# Patient Record
Sex: Male | Born: 1965
Health system: Southern US, Community
[De-identification: ages and names within clinical notes are randomized; demographics above are authoritative.]

## PROBLEM LIST (undated history)

## (undated) DIAGNOSIS — F419 Anxiety disorder, unspecified: Secondary | ICD-10-CM

## (undated) DIAGNOSIS — R399 Unspecified symptoms and signs involving the genitourinary system: Secondary | ICD-10-CM

## (undated) DIAGNOSIS — B019 Varicella without complication: Secondary | ICD-10-CM

## (undated) DIAGNOSIS — I2699 Other pulmonary embolism without acute cor pulmonale: Secondary | ICD-10-CM

## (undated) DIAGNOSIS — D682 Hereditary deficiency of other clotting factors: Secondary | ICD-10-CM

## (undated) HISTORY — DX: Anxiety disorder, unspecified: F41.9

## (undated) HISTORY — DX: Other pulmonary embolism without acute cor pulmonale: I26.99

## (undated) HISTORY — DX: Unspecified symptoms and signs involving the genitourinary system: R39.9

## (undated) HISTORY — DX: Hereditary deficiency of other clotting factors: D68.2

## (undated) HISTORY — PX: EYE SURGERY: SHX253

## (undated) HISTORY — DX: Varicella without complication: B01.9

---

## 2000-10-24 HISTORY — PX: OTHER SURGICAL HISTORY: SHX169

## 2016-10-24 HISTORY — PX: COLON SURGERY: SHX602

## 2018-05-02 ENCOUNTER — Telehealth: Payer: Self-pay

## 2018-05-02 NOTE — Telephone Encounter (Signed)
Please advise 

## 2018-05-02 NOTE — Telephone Encounter (Signed)
Copied from CRM [id number]. Topic: Appointment Scheduling - New Patient >> May 02, 2018 10:42 AM Windy KalataMichael, Taylor L, NT wrote: Patient is calling and is new to the area and was referred to Dr. Drue NovelPaz by patient Dario AveMark Joel. He would like to know if Dr. Drue NovelPaz will accept him as a new patient.   Route to department's PEC pool.

## 2018-05-02 NOTE — Telephone Encounter (Signed)
Please schedule a new patient visit at the patient's convenience

## 2018-05-02 NOTE — Telephone Encounter (Signed)
Called pt and scheduled a NP appt for 06/12/18

## 2018-05-02 NOTE — Telephone Encounter (Signed)
Okay to schedule NP appt at his convenience. Thank you.  

## 2018-06-12 ENCOUNTER — Ambulatory Visit: Payer: Self-pay | Admitting: Internal Medicine

## 2018-06-18 DIAGNOSIS — R3911 Hesitancy of micturition: Secondary | ICD-10-CM

## 2018-06-18 DIAGNOSIS — K5790 Diverticulosis of intestine, part unspecified, without perforation or abscess without bleeding: Secondary | ICD-10-CM | POA: Insufficient documentation

## 2018-06-18 DIAGNOSIS — N401 Enlarged prostate with lower urinary tract symptoms: Secondary | ICD-10-CM | POA: Insufficient documentation

## 2018-06-18 DIAGNOSIS — R590 Localized enlarged lymph nodes: Secondary | ICD-10-CM | POA: Insufficient documentation

## 2018-06-21 ENCOUNTER — Encounter: Payer: Self-pay | Admitting: Internal Medicine

## 2018-06-21 ENCOUNTER — Ambulatory Visit: Payer: PRIVATE HEALTH INSURANCE | Admitting: Internal Medicine

## 2018-06-21 VITALS — BP 115/69 | HR 91 | Temp 99.0°F | Resp 16 | Ht 70.5 in | Wt 276.0 lb

## 2018-06-21 DIAGNOSIS — I2699 Other pulmonary embolism without acute cor pulmonale: Secondary | ICD-10-CM | POA: Insufficient documentation

## 2018-06-21 DIAGNOSIS — K5792 Diverticulitis of intestine, part unspecified, without perforation or abscess without bleeding: Secondary | ICD-10-CM

## 2018-06-21 DIAGNOSIS — F419 Anxiety disorder, unspecified: Secondary | ICD-10-CM | POA: Diagnosis not present

## 2018-06-21 DIAGNOSIS — Z09 Encounter for follow-up examination after completed treatment for conditions other than malignant neoplasm: Secondary | ICD-10-CM | POA: Insufficient documentation

## 2018-06-21 DIAGNOSIS — D682 Hereditary deficiency of other clotting factors: Secondary | ICD-10-CM | POA: Insufficient documentation

## 2018-06-21 DIAGNOSIS — R399 Unspecified symptoms and signs involving the genitourinary system: Secondary | ICD-10-CM | POA: Insufficient documentation

## 2018-06-21 LAB — CBC WITH DIFFERENTIAL/PLATELET
Basophils Absolute: 0.1 10*3/uL (ref 0.0–0.1)
Basophils Relative: 0.8 % (ref 0.0–3.0)
Eosinophils Absolute: 0.3 10*3/uL (ref 0.0–0.7)
Eosinophils Relative: 3.3 % (ref 0.0–5.0)
HEMATOCRIT: 44 % (ref 39.0–52.0)
Hemoglobin: 15.4 g/dL (ref 13.0–17.0)
LYMPHS ABS: 0.7 10*3/uL (ref 0.7–4.0)
Lymphocytes Relative: 9.2 % — ABNORMAL LOW (ref 12.0–46.0)
MCHC: 34.9 g/dL (ref 30.0–36.0)
MCV: 85.2 fl (ref 78.0–100.0)
MONOS PCT: 7.4 % (ref 3.0–12.0)
Monocytes Absolute: 0.6 10*3/uL (ref 0.1–1.0)
NEUTROS PCT: 79.3 % — AB (ref 43.0–77.0)
Neutro Abs: 6.1 10*3/uL (ref 1.4–7.7)
Platelets: 220 10*3/uL (ref 150.0–400.0)
RBC: 5.17 Mil/uL (ref 4.22–5.81)
RDW: 14.3 % (ref 11.5–15.5)
WBC: 7.7 10*3/uL (ref 4.0–10.5)

## 2018-06-21 LAB — COMPREHENSIVE METABOLIC PANEL
ALBUMIN: 4 g/dL (ref 3.5–5.2)
ALK PHOS: 60 U/L (ref 39–117)
ALT: 16 U/L (ref 0–53)
AST: 13 U/L (ref 0–37)
BILIRUBIN TOTAL: 0.9 mg/dL (ref 0.2–1.2)
BUN: 16 mg/dL (ref 6–23)
CALCIUM: 9 mg/dL (ref 8.4–10.5)
CO2: 27 mEq/L (ref 19–32)
Chloride: 106 mEq/L (ref 96–112)
Creatinine, Ser: 0.92 mg/dL (ref 0.40–1.50)
GFR: 91.93 mL/min (ref >60.00)
Glucose, Bld: 108 mg/dL — ABNORMAL HIGH (ref 70–99)
Potassium: 4 mEq/L (ref 3.5–5.1)
Sodium: 140 mEq/L (ref 135–145)
Total Protein: 6.5 g/dL (ref 6.0–8.3)

## 2018-06-21 LAB — POCT INR: INR: 1.9 — AB (ref 2.0–3.0)

## 2018-06-21 NOTE — Assessment & Plan Note (Signed)
Diverticulitis, right-sided: Dx 3 days ago via CAT scan, on Augmentin, clinically much improved.  Recommend to complete 10-day treatment. Abnormal CT: CT done for diverticulitis also showed a subcarinal and hilar lymphadenopathy, they recommended a nonemergent chest CT.  Will order a CT when he comes back in 2 months. 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  10 mg daily is actually his typical dose. INR today 1.9. Plan: Coumadin 10 mg daily except Monday and Thursday 15 mg. INR in 2 weeks. Consider hematology referral. Anxiety: Under excellent control since he started SSRIs approximately 2009 LUTS: At some point had symptoms, PCP prescribed Flomax approximately 4 years ago, it has made a big difference.  Refill as needed ROI from  PCP-GI- hematology Social: frequent flyer for work Follow-up: 2 weeks INR, 2 months office visit

## 2018-06-21 NOTE — Patient Instructions (Addendum)
GO TO THE LAB : Get the blood work     GO TO THE FRONT DESK Schedule your next appointment for a Coumadin check in 2 weeks from today  Schedule a office visit with me in 2 months  Coumadin: INR today is 1.9. Take 10 mg daily except on Thursdays and Mondays: 15 mg

## 2018-06-21 NOTE — Progress Notes (Signed)
Subjective:    Patient ID: Joshua Blair, male    DOB: 05/01/1966, 52 y.o.   MRN: [id number]  DOS:  06/21/2018 Type of visit - description : New patient, here with his wife Interval history:  Patient moved to this area recently.  Was doing well until 5 days ago when he started to develop lower abdominal pain. Went to urgent care 06/18/2018, he was found indeed to be tender in the lower abdomen, had a low-grade fever.  CT was done and showed diverticulitis. No blood work. Prescribed Augmentin and he is feeling much improved.  Coumadin therapy: Most recent INR was 3.5 few days ago, at the time he was taking 10 mg daily except Wednesday and Saturday: 20 mg. He was recommended to take 10 mg daily  Anxiety: On citalopram, well controlled  Review of Systems Currently with no fever chills No nausea, vomiting, diarrhea. No constipation. Denies any blood in the stools or blood in the urine.   Past Medical History:  Diagnosis Date  . Chicken pox   . Pulmonary emboli [facility])     Past Surgical History:  Procedure Laterality Date  . cholecystectomy  2002  . COLON SURGERY  2018   to remove benign polyps  . EYE SURGERY Bilateral 2015 rt/ 2017 lt   for detached retina    Social History   Socioeconomic History  . Marital status: Married    Spouse name: Maralyn Sago  . Number of children: 3  . Years of education: Not on file  . Highest education level: Not on file  Occupational History  . Occupation: Product manager  Social Needs  . Financial resource strain: Not hard at all  . Food insecurity:    Worry: Never true    Inability: Never true  . Transportation needs:    Medical: No    Non-medical: No  Tobacco Use  . Smoking status: Former Smoker    Last attempt to quit: 06/21/2006    Years since quitting: 12.0  . Smokeless tobacco: Never Used  Substance and Sexual Activity  . Alcohol use: Yes    Comment: rarely  . Drug use: Never  . Sexual activity: Yes    Partners: Female   Lifestyle  . Physical activity:    Days per week: 0 days    Minutes per session: Not on file  . Stress: Not on file  Relationships  . Social connections:    Talks on phone: Not on file    Gets together: Never    Attends religious service: 1 to 4 times per year    Active member of club or organization: No    Attends meetings of clubs or organizations: Never    Relationship status: Married  . Intimate partner violence:    Fear of current or ex partner: No    Emotionally abused: No    Physically abused: No    Forced sexual activity: No  Other Topics Concern  . Not on file  Social History Narrative   Moved to [place] 2019 from [place]      Family History  Problem Relation Age of Onset  . Hypertension Mother   . Diabetes Mother   . Diabetes Brother   . Diabetes Brother   . Diabetes Brother   . Diabetes Brother   . Diabetes Brother   . Diabetes Brother   . HIV Brother   . Colon cancer Neg Hx   . Prostate cancer Neg Hx      Allergies as  of 06/21/2018   Not on File     Medication List        Accurate as of 06/21/18  5:45 PM. Always use your most recent med list.          amoxicillin-clavulanate 875-125 MG tablet Commonly known as:  AUGMENTIN Take by mouth.   citalopram 20 MG tablet Commonly known as:  CELEXA Take by mouth.   tamsulosin 0.4 MG Caps capsule Commonly known as:  FLOMAX Take by mouth.   warfarin 10 MG tablet Commonly known as:  COUMADIN TAKE 1 TABLET BY MOUTH DAILY FOR 6 DAYS A WEEK THEN ONE AND ONE HALF TABLET ONCE A WEEK OR AS DIRECTED          Objective:   Physical Exam BP 115/69 (BP Location: Right Arm, Patient Position: Sitting, Cuff Size: Large)   Pulse 91   Temp 99 F (37.2 C) (Oral)   Resp 16   Ht 5' 10.5" (1.791 m)   Wt 276 lb (125.2 kg)   SpO2 98%   BMI 39.04 kg/m  General:   Well developed, NAD, see BMI.  HEENT:  Normocephalic . Face symmetric, atraumatic Lungs:  CTA B Normal respiratory effort, no intercostal  retractions, no accessory muscle use. Heart: RRR,  no murmur.  no pretibial edema bilaterally  Abdomen:  Not distended, soft, non-tender. No rebound or rigidity.   Skin: Not pale. Not jaundice Neurologic:  alert & oriented X3.  Speech normal, gait appropriate for age and unassisted Psych--  Cognition and judgment appear intact.  Cooperative with normal attention span and concentration.  Behavior appropriate. No anxious or depressed appearing.     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  Recommend to complete 10-day treatment. Abnormal CT: CT done for diverticulitis also showed a subcarinal and hilar lymphadenopathy, they recommended a nonemergent chest CT.  Will order a CT when he comes back in 2 months. 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  10 mg daily is actually his typical dose. INR today 1.9. Plan: Coumadin 10 mg daily except Monday and Thursday 15 mg. INR in 2 weeks. Consider hematology referral. Anxiety: Under excellent control since he started SSRIs approximately 2009 LUTS: At some point had symptoms, PCP prescribed Flomax approximately 4 years ago, it has made a big difference.  Refill as needed ROI from  PCP-GI- hematology Social: frequent flyer for work Follow-up: 2 weeks INR, 2 months office visit  Today, I spent more than   \30 min with the patient: >50% of the time counseling regards coumadin mngmt, abnormal CT report, multiple questions answer

## 2018-07-05 ENCOUNTER — Ambulatory Visit: Payer: PRIVATE HEALTH INSURANCE

## 2018-07-08 ENCOUNTER — Telehealth: Payer: Self-pay | Admitting: Internal Medicine

## 2018-07-08 NOTE — Telephone Encounter (Signed)
Due for an INR, please arrange 

## 2018-07-09 NOTE — Telephone Encounter (Addendum)
Called patient to schedule INR check. States he is out of town in [place] working and won't be back for a month. Scheduled INR check for  10/15/119. Patietn states he has had not complications.

## 2018-07-09 NOTE — Telephone Encounter (Signed)
Called patient who states he can go to [facility] to get [facility]R checked, reminded patient to call office with results and keep appointment he has scheduled with this office.. Patient agreed.

## 2018-07-09 NOTE — Telephone Encounter (Signed)
At the last visit, his Coumadin dose was adjusted. I recommend the patient go to urgent care in [place] and get his INR checked this week. Also keep the appointment as a schedule.

## 2018-08-07 ENCOUNTER — Ambulatory Visit: Payer: PRIVATE HEALTH INSURANCE

## 2018-08-10 ENCOUNTER — Ambulatory Visit (INDEPENDENT_AMBULATORY_CARE_PROVIDER_SITE_OTHER): Payer: PRIVATE HEALTH INSURANCE | Admitting: Internal Medicine

## 2018-08-10 ENCOUNTER — Encounter: Payer: Self-pay | Admitting: Internal Medicine

## 2018-08-10 ENCOUNTER — Ambulatory Visit: Payer: PRIVATE HEALTH INSURANCE | Admitting: Internal Medicine

## 2018-08-10 VITALS — BP 128/84 | HR 85 | Temp 98.5°F | Resp 16 | Ht 70.5 in | Wt 282.5 lb

## 2018-08-10 DIAGNOSIS — H9209 Otalgia, unspecified ear: Secondary | ICD-10-CM

## 2018-08-10 DIAGNOSIS — Z7901 Long term (current) use of anticoagulants: Secondary | ICD-10-CM

## 2018-08-10 DIAGNOSIS — Z23 Encounter for immunization: Secondary | ICD-10-CM

## 2018-08-10 LAB — POCT INR: INR: 3.6 — AB (ref 2.0–3.0)

## 2018-08-10 NOTE — Progress Notes (Signed)
Subjective:    Patient ID: Joshua Blair, male    DOB: Nov 21, 1965, 52 y.o.   MRN: [id number]  DOS:  08/10/2018 Type of visit - description : acute Interval history: He was here for a Coumadin check but also complaining of left ear pain. Symptoms of started this morning, he feels like a stinging at the outside of the ear canal.  Denies any injury with a Q-tip  Review of Systems Denies any purulent or bloody discharge from the ear. Hearing is normal No recent URI  Past Medical History:  Diagnosis Date  . Anxiety   . Chicken pox   . Factor V deficiency (HCC)   . Lower urinary tract symptoms (LUTS)   . Pulmonary emboli [facility])     Past Surgical History:  Procedure Laterality Date  . cholecystectomy  2002  . COLON SURGERY  2018   to remove benign polyps  . EYE SURGERY Bilateral 2015 rt/ 2017 lt   for detached retina    Social History   Socioeconomic History  . Marital status: Married    Spouse name: Maralyn Sago  . Number of children: 3  . Years of education: Not on file  . Highest education level: Not on file  Occupational History  . Occupation: Product manager  Social Needs  . Financial resource strain: Not hard at all  . Food insecurity:    Worry: Never true    Inability: Never true  . Transportation needs:    Medical: No    Non-medical: No  Tobacco Use  . Smoking status: Former Smoker    Last attempt to quit: 06/21/2006    Years since quitting: 12.1  . Smokeless tobacco: Never Used  Substance and Sexual Activity  . Alcohol use: Yes    Comment: rarely  . Drug use: Never  . Sexual activity: Yes    Partners: Female  Lifestyle  . Physical activity:    Days per week: 0 days    Minutes per session: Not on file  . Stress: Not on file  Relationships  . Social connections:    Talks on phone: Not on file    Gets together: Never    Attends religious service: 1 to 4 times per year    Active member of club or organization: No    Attends meetings of clubs or  organizations: Never    Relationship status: Married  . Intimate partner violence:    Fear of current or ex partner: No    Emotionally abused: No    Physically abused: No    Forced sexual activity: No  Other Topics Concern  . Not on file  Social History Narrative   Moved to [place] 2019 from [place]       Allergies as of 08/10/2018      Reactions   Augmentin [amoxicillin-pot Clavulanate] Swelling   Penicillins Swelling      Medication List        Accurate as of 08/10/18 11:59 PM. Always use your most recent med list.          citalopram 20 MG tablet Commonly known as:  CELEXA Take 20 mg by mouth daily.   tamsulosin 0.4 MG Caps capsule Commonly known as:  FLOMAX Take 0.4 mg by mouth daily after supper.   warfarin 10 MG tablet Commonly known as:  COUMADIN TAKE AS DIRECTED BY COUMADIN CLINIC          Objective:   Physical Exam BP 128/84 (BP Location: Left Arm, Patient  Position: Sitting, Cuff Size: Normal)   Pulse 85   Temp 98.5 F (36.9 C) (Oral)   Resp 16   Ht 5' 10.5" (1.791 m)   Wt 282 lb 8 oz (128.1 kg)   SpO2 98%   BMI 39.96 kg/m  General:   Well developed, NAD, see BMI.  HEENT:  Normocephalic . Face symmetric, atraumatic Right ear: TM normal, has a ear tube in place. Left ear: TM normal, has a ear tube in place.  Canal is carefully inspected, I do not see any redness, swelling,  skin: Not pale. Not jaundice Neurologic:  alert & oriented X3.  Speech normal, gait appropriate for age and unassisted Psych--  Cognition and judgment appear intact.  Cooperative with normal attention span and concentration.  Behavior appropriate. No anxious or depressed appearing.      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  Recommend observation, he is going out of town, go to urgent care if symptoms increase Abnormal CT: Patient aware of results, ask him to let me know what is a convenient time to rte-do the CT and I'll arrange it (pt travels for work) Coumadin management: he is leaving town today and does not known exactly when he is returning.  I asked him to come back no later than 3 weeks for a Coumadin check.

## 2018-08-10 NOTE — Progress Notes (Signed)
Pre visit review using our clinic review tool, if applicable. No additional management support is needed unless otherwise documented below in the visit note. 

## 2018-08-10 NOTE — Progress Notes (Signed)
Pt here for INR check per Dr. Drue Novel PCP  Goal INR =2.0-3.0  Last INR = 1.9 on 06/21/18  Pt currently takes Coumadin 10 mg TAKE 1 TABLET BY MOUTH DAILY FOR 6 DAYS A WEEK THEN ONE AND ONE HALF TABLET ONCE A WEEK OR AS DIRECTED  Patient has been on antibiotics in august for diverticulitis, no dietary changes and no unusual bruising / bleeding.  INR today = 3.6  Pt advised per Dr Drue Novel Hold for 1 day. Then take 10mg  daily on wednesdays take 5 mg.   Complains of left ear, feels like there is an ingrown hair, redness, painful to touch started this morning. Will see Dr. Drue Novel this morning at 9:00.  Next INR 2 weeks, d/p work schedule that might no be possible, rec no later  than 3 weeks  Willow Ora, MD

## 2018-08-10 NOTE — Patient Instructions (Signed)
Come back for a INR in 3 weeks  Call a let us know when is a good time to get a CAT scan of the chest

## 2018-08-11 NOTE — Assessment & Plan Note (Signed)
Left otalgia: No clear etiology, exam is essentially normal except for the presence of ear tubes.  Recommend observation, he is going out of town, go to urgent care if symptoms increase Abnormal CT: Patient aware of results, ask him to let me know what is a convenient time to rte-do the CT and I'll arrange it (pt travels for work) Coumadin management: he is leaving town today and does not known exactly when he is returning.  I asked him to come back no later than 3 weeks for a Coumadin check.

## 2018-08-16 ENCOUNTER — Telehealth: Payer: Self-pay

## 2018-08-16 NOTE — Telephone Encounter (Signed)
That is ok 

## 2018-08-16 NOTE — Telephone Encounter (Signed)
Copied from CRM [id number]. Topic: General - Inquiry >> Aug 09, 2018  8:42 AM Gaynelle Adu wrote: Reason for CRM: Patient is calling to state his is back into town today and he would like to schedule  a  PT- INR before Noon or 2pm for a appt.  He is requesting a call back.  Please advise   08/16/18 Called patient scheduled appointment for 11/28/196 INR check.

## 2018-08-20 ENCOUNTER — Ambulatory Visit (INDEPENDENT_AMBULATORY_CARE_PROVIDER_SITE_OTHER): Payer: PRIVATE HEALTH INSURANCE

## 2018-08-20 DIAGNOSIS — Z7901 Long term (current) use of anticoagulants: Secondary | ICD-10-CM | POA: Diagnosis not present

## 2018-08-20 LAB — POCT INR: INR: 3.8 — AB (ref 2.0–3.0)

## 2018-08-20 NOTE — Progress Notes (Addendum)
Pre visit review using our clinic tool,if applicable. No additional management support is needed unless otherwise documented below in the visit note.   Pt here for INR check per  Goal INR = 2.0 - 3.0  Last INR = 3.6  Pt currently takes Coumadin  10 mg daily except Wednesday take 5 mg  Pt denies recent antibiotics, no dietary changes and no unusual bruising / bleeding.   States he has been off ABT x 2 weeks.  INR today = 3.8   Pt advised per Dr. Drue Novel take Coumadin 10 mg daily except Monday and Thursday take 5 mg. Return for INR check in 2 weeks.   Patient states he wants to talk to Dr. Drue Novel when he returns regarding changing some of his medications. Advised he would need to schedule appointment for OV with provider.  Patient states he will schedule for 2 week appointment with Dr. Drue Novel.    Willow Ora, MD

## 2018-08-27 ENCOUNTER — Ambulatory Visit: Payer: PRIVATE HEALTH INSURANCE | Admitting: Internal Medicine

## 2018-09-03 ENCOUNTER — Ambulatory Visit: Payer: PRIVATE HEALTH INSURANCE | Admitting: Internal Medicine

## 2018-09-10 ENCOUNTER — Ambulatory Visit: Payer: PRIVATE HEALTH INSURANCE | Admitting: Internal Medicine

## 2018-09-10 ENCOUNTER — Ambulatory Visit (HOSPITAL_BASED_OUTPATIENT_CLINIC_OR_DEPARTMENT_OTHER)
Admission: RE | Admit: 2018-09-10 | Discharge: 2018-09-10 | Disposition: A | Discharge status: Home / Self Care | Payer: PRIVATE HEALTH INSURANCE | Source: Ambulatory Visit | Attending: Internal Medicine | Admitting: Internal Medicine

## 2018-09-10 ENCOUNTER — Encounter: Payer: Self-pay | Admitting: Internal Medicine

## 2018-09-10 VITALS — BP 134/62 | HR 84 | Temp 98.4°F | Resp 16 | Ht 71.0 in | Wt 287.4 lb

## 2018-09-10 DIAGNOSIS — G4733 Obstructive sleep apnea (adult) (pediatric): Secondary | ICD-10-CM

## 2018-09-10 DIAGNOSIS — Z23 Encounter for immunization: Secondary | ICD-10-CM | POA: Diagnosis not present

## 2018-09-10 DIAGNOSIS — Z7901 Long term (current) use of anticoagulants: Secondary | ICD-10-CM | POA: Diagnosis not present

## 2018-09-10 DIAGNOSIS — Z9989 Dependence on other enabling machines and devices: Secondary | ICD-10-CM

## 2018-09-10 DIAGNOSIS — Z125 Encounter for screening for malignant neoplasm of prostate: Secondary | ICD-10-CM | POA: Diagnosis not present

## 2018-09-10 DIAGNOSIS — R739 Hyperglycemia, unspecified: Secondary | ICD-10-CM

## 2018-09-10 DIAGNOSIS — R590 Localized enlarged lymph nodes: Secondary | ICD-10-CM | POA: Diagnosis present

## 2018-09-10 DIAGNOSIS — R5383 Other fatigue: Secondary | ICD-10-CM | POA: Diagnosis not present

## 2018-09-10 DIAGNOSIS — Z13220 Encounter for screening for lipoid disorders: Secondary | ICD-10-CM | POA: Diagnosis not present

## 2018-09-10 DIAGNOSIS — Z114 Encounter for screening for human immunodeficiency virus [HIV]: Secondary | ICD-10-CM | POA: Diagnosis not present

## 2018-09-10 DIAGNOSIS — D682 Hereditary deficiency of other clotting factors: Secondary | ICD-10-CM

## 2018-09-10 DIAGNOSIS — Z09 Encounter for follow-up examination after completed treatment for conditions other than malignant neoplasm: Secondary | ICD-10-CM

## 2018-09-10 LAB — LIPID PANEL
CHOL/HDL RATIO: 5
CHOLESTEROL: 145 mg/dL (ref 0–200)
HDL: 29.8 mg/dL — ABNORMAL LOW (ref >39.00)
NONHDL: 115.16
Triglycerides: 243 mg/dL — ABNORMAL HIGH (ref 0.0–149.0)
VLDL: 48.6 mg/dL — ABNORMAL HIGH (ref 0.0–40.0)

## 2018-09-10 LAB — LDL CHOLESTEROL, DIRECT: Direct LDL: 86 mg/dL

## 2018-09-10 LAB — TSH: TSH: 3.7 u[IU]/mL (ref 0.35–4.50)

## 2018-09-10 LAB — HEMOGLOBIN A1C: Hgb A1c MFr Bld: 5.2 % (ref 4.6–6.5)

## 2018-09-10 LAB — POCT INR: INR: 1.2 — AB (ref 2.0–3.0)

## 2018-09-10 LAB — B12 AND FOLATE PANEL
FOLATE: 8.8 ng/mL (ref >5.9)
VITAMIN B 12: 384 pg/mL (ref 211–911)

## 2018-09-10 LAB — PSA: PSA: 2.04 ng/mL (ref 0.10–4.00)

## 2018-09-10 LAB — PROTIME-INR
INR: 1.3 ratio — AB (ref 0.8–1.0)
Prothrombin Time: 14.8 s — ABNORMAL HIGH (ref 9.6–13.1)

## 2018-09-10 MED ORDER — CITALOPRAM HYDROBROMIDE 20 MG PO TABS: ORAL_TABLET | ORAL | 6 refills | Status: DC

## 2018-09-10 NOTE — Progress Notes (Addendum)
Subjective:    Patient ID: Joshua Blair, male    DOB: 01-26-1966, 52 y.o.   MRN: [id number]  DOS:  09/10/2018 Type of visit - description : Routine checkup  To discuss several issues: Coumadin management Due for CT chest Anxiety: Symptoms well controlled with citalopram but reports lack of motivation. He goes to work , once he is back home he is completely tired and unmotivated to do anything else.  Symptoms of started approximately 2 years ago.  Denies depression symptoms per se, no suicidal ideas. Has soft lump at the right arm for 6 months  Review of Systems Denies fever.  No chills or weight loss No cough or difficulty breathing  Past Medical History:  Diagnosis Date  . Anxiety   . Chicken pox   . Factor V deficiency (HCC)   . Lower urinary tract symptoms (LUTS)   . Pulmonary emboli [facility](HCC)     Past Surgical History:  Procedure Laterality Date  . cholecystectomy  2002  . COLON SURGERY  2018   to remove benign polyps  . EYE SURGERY Bilateral 2015 rt/ 2017 lt   for detached retina    Social History   Socioeconomic History  . Marital status: Married    Spouse name: Joshua Blair  . Number of children: 3  . Years of education: Not on file  . Highest education level: Not on file  Occupational History  . Occupation: Product managerndustrial electrician  Social Needs  . Financial resource strain: Not hard at all  . Food insecurity:    Worry: Never true    Inability: Never true  . Transportation needs:    Medical: No    Non-medical: No  Tobacco Use  . Smoking status: Former Smoker    Last attempt to quit: 06/21/2006    Years since quitting: 12.2  . Smokeless tobacco: Never Used  Substance and Sexual Activity  . Alcohol use: Yes    Comment: rarely  . Drug use: Never  . Sexual activity: Yes    Partners: Female  Lifestyle  . Physical activity:    Days per week: 0 days    Minutes per session: Not on file  . Stress: Not on file  Relationships  . Social connections:    Talks  on phone: Not on file    Gets together: Never    Attends religious service: 1 to 4 times per year    Active member of club or organization: No    Attends meetings of clubs or organizations: Never    Relationship status: Married  . Intimate partner violence:    Fear of current or ex partner: No    Emotionally abused: No    Physically abused: No    Forced sexual activity: No  Other Topics Concern  . Not on file  Social History Narrative   Moved to [place] 2019 from [place]       Allergies as of 09/10/2018      Reactions   Augmentin [amoxicillin-pot Clavulanate] Swelling   Penicillins Swelling      Medication List        Accurate as of 09/10/18  7:50 PM. Always use your most recent med list.          citalopram 20 MG tablet Commonly known as:  CELEXA 1.5 tablets daily for 2 weeks, then 2 tablets daily   tamsulosin 0.4 MG Caps capsule Commonly known as:  FLOMAX Take 0.4 mg by mouth daily after supper.   warfarin 10 MG  tablet Commonly known as:  COUMADIN Take as directed by the anticoagulation clinic. If you are unsure how to take this medication, talk to your nurse or doctor. Original instructions:  TAKE AS DIRECTED BY COUMADIN CLINIC           Objective:   Physical Exam  Musculoskeletal:       Arms:  BP 134/62 (BP Location: Left Arm, Patient Position: Sitting, Cuff Size: Normal)   Pulse 84   Temp 98.4 F (36.9 C) (Oral)   Resp 16   Ht 5\' 11"  (1.803 m)   Wt 287 lb 6 oz (130.4 kg)   SpO2 96%   BMI 40.08 kg/m   General:   Well developed, NAD, BMI noted. HEENT:  Normocephalic . Face symmetric, atraumatic Lungs:  CTA B Normal respiratory effort, no intercostal retractions, no accessory muscle use. Lymphatic system: No LAD is at the neck or axillary areas Heart: RRR,  no murmur.  No pretibial edema bilaterally  Skin: Not pale. Not jaundice Neurologic:  alert & oriented X3.  Speech normal, gait appropriate for age and unassisted Psych--  Cognition  and judgment appear intact.  Cooperative with normal attention span and concentration.  Behavior appropriate. No anxious or depressed appearing.      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 The patient did not follow instructions as recommended, he is actually taking 10 mg daily except twice a week he takes 15 (80 mg weekly). Compliance ? Plan: Confirm INR with in the lab, refer to hematology, Eliquis?. Preventive care: Tdap today, labs: HIV , FLP PSA Mild hyperglycemia noted, check a A1c. Lump, right arm: We agreed on observation for now.  Will reassess every 6 months. Anxiety: On citalopram 20 mg qd x 10 years, it worked very well for him.  He was quite irritable and "flying off the handle" frequently.  For the last 2 years he has noted to be quite unmotivated, specifically once he gets home after working he is tired and not willing or able to do anything but just sit down and rest. He thinks is a s/e of citalopram.  His CPAP settings were checked 3 months ago and they were okay.  Recent labs satisfactory. Plan: Will increase citalopram dose from 20-40, see if motivation increases.  At the same time he needs to start exercise program and try to lose weight.  Check a TSH, B12, folic acid. RTC 3 months

## 2018-09-10 NOTE — Progress Notes (Signed)
Pre visit review using our clinic review tool, if applicable. No additional management support is needed unless otherwise documented below in the visit note. 

## 2018-09-10 NOTE — Assessment & Plan Note (Addendum)
  Abnormal CT abdomen: He was found to have mediastinal and hilar adenopathies, radiology recommended a CT chest.  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 The patient did not follow instructions as recommended, he is actually taking 10 mg daily except twice a week he takes 15 (80 mg weekly). Compliance ? Plan: Confirm INR with in the lab, refer to hematology, Eliquis?. Preventive care: Tdap today, labs: HIV , FLP PSA Mild hyperglycemia noted, check a A1c. Lump, right arm: We agreed on observation for now.  Will reassess every 6 months. Anxiety: On citalopram 20 mg qd x 10 years, it worked very well for him.  He was quite irritable and "flying off the handle" frequently.  For the last 2 years he has noted to be quite unmotivated, specifically once he gets home after working he is tired and not willing or able to do anything but just sit down and rest. He thinks is a s/e of citalopram.  His CPAP settings were checked 3 months ago and they were okay.  Recent labs satisfactory. Plan: Will increase citalopram dose from 20-40, see if motivation increases.  At the same time he needs to start exercise program and try to lose weight.  Check a TSH, B12, folic acid. RTC 3 months

## 2018-09-10 NOTE — Patient Instructions (Signed)
GO TO THE LAB : Get the blood work     GO TO THE FRONT DESK Schedule your next appointment for a  checkup in 3 months  

## 2018-09-11 ENCOUNTER — Telehealth: Payer: Self-pay

## 2018-09-11 DIAGNOSIS — R9389 Abnormal findings on diagnostic imaging of other specified body structures: Secondary | ICD-10-CM

## 2018-09-11 DIAGNOSIS — R590 Localized enlarged lymph nodes: Secondary | ICD-10-CM

## 2018-09-11 NOTE — Telephone Encounter (Signed)
Pt having abdominal pain- hx of diverticulosis- wanting to know if PCP could send in Abx? He is aware of new dosage of Coumadin.

## 2018-09-11 NOTE — Telephone Encounter (Signed)
Unable to send antibiotics, if he has abdominal pain he needs to be seen.

## 2018-09-11 NOTE — Telephone Encounter (Signed)
Also, please let him know that the CT of the chest shows enlarged lymph nodes, significance unclear but needs to see the specialist, arrange a pulmonary referral.  DX abnormal CT chest.

## 2018-09-11 NOTE — Telephone Encounter (Signed)
LMOM informing Pt to return call. Referral placed to Dr. Sherene SiresWert at pulmonology.

## 2018-09-14 LAB — HIV ANTIBODY (ROUTINE TESTING W REFLEX): HIV 1&2 Ab, 4th Generation: NONREACTIVE

## 2018-09-14 LAB — VITAMIN B1: Vitamin B1 (Thiamine): 9 nmol/L (ref 8–30)

## 2018-09-17 NOTE — Telephone Encounter (Signed)
Send a letter informing him about the need to do a referral, and arrange the referral.

## 2018-09-17 NOTE — Telephone Encounter (Signed)
LMOM asking for call back.   Dr. Drue NovelPaz I have been unable to inform Pt of results.

## 2018-09-17 NOTE — Telephone Encounter (Signed)
Letter mailed to Pt informed of abnormal chest CT- and importance to see pulmonary. Number given for him to call and schedule.

## 2018-09-21 ENCOUNTER — Other Ambulatory Visit: Payer: PRIVATE HEALTH INSURANCE

## 2018-09-21 ENCOUNTER — Ambulatory Visit: Payer: PRIVATE HEALTH INSURANCE | Admitting: Hematology & Oncology

## 2018-10-08 ENCOUNTER — Telehealth: Payer: Self-pay | Admitting: Hematology

## 2018-10-08 NOTE — Telephone Encounter (Signed)
Returned call to pt to r/s lab/new pt appt per phone message. Gave pt new date/time 11/15/18 at 0845 am

## 2018-10-12 ENCOUNTER — Ambulatory Visit: Payer: PRIVATE HEALTH INSURANCE | Admitting: Hematology

## 2018-10-12 ENCOUNTER — Other Ambulatory Visit: Payer: PRIVATE HEALTH INSURANCE

## 2018-11-05 ENCOUNTER — Ambulatory Visit (INDEPENDENT_AMBULATORY_CARE_PROVIDER_SITE_OTHER): Payer: PRIVATE HEALTH INSURANCE | Admitting: Internal Medicine

## 2018-11-05 ENCOUNTER — Encounter: Payer: Self-pay | Admitting: Internal Medicine

## 2018-11-05 VITALS — BP 120/68 | HR 88 | Ht 71.0 in | Wt 286.0 lb

## 2018-11-05 DIAGNOSIS — R590 Localized enlarged lymph nodes: Secondary | ICD-10-CM

## 2018-11-05 LAB — CBC WITH DIFFERENTIAL/PLATELET
BASOS PCT: 0.8 % (ref 0.0–3.0)
Basophils Absolute: 0.1 10*3/uL (ref 0.0–0.1)
Eosinophils Absolute: 0.3 10*3/uL (ref 0.0–0.7)
Eosinophils Relative: 3.8 % (ref 0.0–5.0)
HCT: 44.3 % (ref 39.0–52.0)
Hemoglobin: 15.3 g/dL (ref 13.0–17.0)
LYMPHS PCT: 15.2 % (ref 12.0–46.0)
Lymphs Abs: 1.1 10*3/uL (ref 0.7–4.0)
MCHC: 34.4 g/dL (ref 30.0–36.0)
MCV: 85.2 fl (ref 78.0–100.0)
Monocytes Absolute: 0.6 10*3/uL (ref 0.1–1.0)
Monocytes Relative: 7.6 % (ref 3.0–12.0)
NEUTROS ABS: 5.4 10*3/uL (ref 1.4–7.7)
Neutrophils Relative %: 72.6 % (ref 43.0–77.0)
Platelets: 201 10*3/uL (ref 150.0–400.0)
RBC: 5.2 Mil/uL (ref 4.22–5.81)
RDW: 14.6 % (ref 11.5–15.5)
WBC: 7.5 10*3/uL (ref 4.0–10.5)

## 2018-11-05 LAB — BASIC METABOLIC PANEL
BUN: 16 mg/dL (ref 6–23)
CO2: 29 mEq/L (ref 19–32)
Calcium: 9.4 mg/dL (ref 8.4–10.5)
Chloride: 104 mEq/L (ref 96–112)
Creatinine, Ser: 1.26 mg/dL (ref 0.40–1.50)
GFR: 63.85 mL/min (ref >60.00)
Glucose, Bld: 83 mg/dL (ref 70–99)
Potassium: 4 mEq/L (ref 3.5–5.1)
Sodium: 140 mEq/L (ref 135–145)

## 2018-11-05 LAB — SEDIMENTATION RATE: Sed Rate: 17 mm/hr (ref 0–20)

## 2018-11-05 NOTE — Progress Notes (Signed)
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 Pulmonary Consult    Referred by Dr. Larose Kells for eval of abnormal ct chest. Pt states has hx of PE- dxed in 2010.   Dyspnea:  Not limited by breathing from desired activities   Cough: no Sleep: on cpap    SABA use: none  No obvious day to day or daytime variability or assoc excess/ purulent sputum or mucus plugs or hemoptysis or cp or chest tightness, subjective wheeze or overt sinus or hb symptoms.   Sleeping as above  without nocturnal  or early am exacerbation  of respiratory  c/o's or need for noct saba. Also denies any obvious fluctuation of symptoms with weather or environmental changes or other aggravating or alleviating factors except as outlined above   No unusual exposure hx or h/o childhood pna/ asthma or knowledge of premature birth.  Current Allergies, Complete Past Medical History, Past Surgical History, Family History, and Social History were reviewed in [employer] record.  ROS  The following are not active complaints unless bolded Hoarseness, sore throat, dysphagia, dental problems, itching, sneezing,  nasal congestion or discharge of excess mucus or purulent secretions, ear ache,   fever, chills, sweats, unintended wt loss or wt gain, classically pleuritic or exertional cp,  orthopnea pnd or arm/hand swelling  or leg swelling, presyncope, palpitations, abdominal pain, anorexia, nausea, vomiting, diarrhea  or change in bowel habits or  change in bladder habits, change in stools or change in urine, dysuria, hematuria,  rash, arthralgias, visual complaints, headache, numbness, weakness or ataxia or problems with walking or coordination,  change in mood or  memory.             Past Medical History:  Diagnosis Date  . Anxiety   . Chicken pox   . Factor V deficiency ([place])   . Lower urinary tract symptoms (LUTS)   . Pulmonary emboli [facility])     Outpatient Medications Prior to Visit  Medication Sig Dispense Refill  . citalopram (CELEXA) 20 MG tablet 1.5 tablets daily for 2 weeks, then 2 tablets daily 60 tablet 6  . tamsulosin (FLOMAX) 0.4 MG CAPS capsule Take 0.4 mg by mouth daily after supper.     Marland Kitchen UNABLE TO FIND Med Name: CPAP    . warfarin (COUMADIN) 10 MG tablet TAKE AS DIRECTED BY COUMADIN CLINIC     No facility-administered medications prior to visit.      Objective:     BP 120/68 (BP Location: Left Arm, Cuff Size: Normal)   Pulse 88   Ht _0  (1.803 m)   Wt 286 lb (129.7 kg)   SpO2 95%   BMI 39.89 kg/m   SpO2: 95 %RA   HEENT: nl dentition, turbinates bilaterally, and oropharynx. Nl external ear canals without cough reflex   NECK :  without JVD/Nodes/TM/ nl carotid upstrokes bilaterally   LUNGS:  no acc muscle use,  Nl contour chest which is clear to A and P bilaterally without cough on insp or exp maneuvers   CV:  RRR  no s3 or murmur or increase in P2, and no edema   ABD:  soft and nontender with nl inspiratory excursion in the supine position. No bruits or organomegaly appreciated, bowel sounds nl  MS:  Nl gait/ ext warm without deformities, calf tenderness, cyanosis or clubbing No obvious joint restrictions   SKIN: warm and dry without lesions    NEURO:  alert, approp, nl sensorium with  no motor or cerebellar deficits apparent.     I personally reviewed images and agree with radiology impression as follows:   Chest CT w/o contrast 09/10/18 Subcarinal adenopathy is noted with  largest lymph node measuring 2 cm. 1.6 cm left hilar lymph node is noted. Aortopulmonary window adenopathy is noted with largest lymph node measuring 14 mm. Right paratracheal adenopathy is noted with largest lymph node measuring 16 mm.     Lab Results  Component Value Date   ESRSEDRATE 17 11/05/2018     Labs ordered 11/05/2018  Histo antibodies      Assessment   Mediastinal adenopathy Hilar adenopathy noted on CT abdomen 06/18/18 - confirmed CT Chest w/o contrast 09/10/18  - esr 11/05/2018  = 17 - Histo Ab 11/06/2018 >>>    The cut off for w/u in pts with asymptomatic adenopathy is 2 cm so technically he was at that threshold (at last ct) but he is on coumadin for dvt/ pe with FV mutation so would be at higher risk of an EBUS bx and we don't actually know what the nodes looked like on his previous studies.  In addition, he's from [place] so needs histo antibodies as well.   In any case,  Adenopathy is not an indication for  an urgent w/u in this setting so can wait at least 3 months from previous study to get contrasted look for apples to apples comparison and await records from [facility] in meantime   Discussed in detail all the  indications, usual  risks and alternatives  relative to the benefits with patient who agrees to proceed with conservative f/u as outlined      Total time devoted to counseling  > 50 % of initial 60 min office visit:  review case with pt/ discussion of options/alternatives/ personally creating written customized instructions  in presence of pt  then going over those specific  Instructions directly with the pt including how to use all of the meds but in particular covering each new medication in detail and the difference between the maintenance= "automatic" meds and the prns using an action plan format for the latter (If this problem/symptom => do that organization reading Left to right).  Please see AVS from this visit for a full list of these instructions  which I personally wrote for this pt and  are unique to this visit.      Christinia Gully, MD 11/05/2018

## 2018-11-05 NOTE — Patient Instructions (Addendum)
Please see patient coordinator before you leave today  to schedule for CT chest with contrast to follow up your lymph nodes and I will call you with a plan   If you can find a prior CT chest report, see what size the lymph nodes are and send [place] a report    Please remember to go to the lab department   for your tests - we will call you with the results when they are available.

## 2018-11-06 ENCOUNTER — Encounter: Payer: Self-pay | Admitting: Internal Medicine

## 2018-11-06 NOTE — Assessment & Plan Note (Addendum)
Hilar adenopathy noted on CT abdomen 06/18/18 - confirmed CT Chest w/o contrast 09/10/18  - esr 11/05/2018  = 17 - Histo Ab 11/06/2018 >>>    The cut off for w/u in pts with asymptomatic adenopathy is 2 cm so technically he was at that threshold (at last ct) but he is on coumadin for dvt/ pe with FV mutation so would be at higher risk of an EBUS bx and we don't actually know what the nodes looked like on his previous studies.  In addition, he's from [place] so needs histo antibodies as well.   In any case,  Adenopathy is not an indication for  an urgent w/u in this setting so can wait at least 3 months from previous study to get contrasted look for apples to apples comparison and await records from [facility] in meantime       Discussed in detail all the  indications, usual  risks and alternatives  relative to the benefits with patient who agrees to proceed with conservative f/u as outlined      Total time devoted to counseling  > 50 % of initial 60 min office visit:  review case with pt/ discussion of options/alternatives/ personally creating written customized instructions  in presence of pt  then going over those specific  Instructions directly with the pt including how to use all of the meds but in particular covering each new medication in detail and the difference between the maintenance= "automatic" meds and the prns using an action plan format for the latter (If this problem/symptom => do that organization reading Left to right).  Please see AVS from this visit for a full list of these instructions which I personally wrote for this pt and  are unique to this visit.

## 2018-11-08 ENCOUNTER — Other Ambulatory Visit: Payer: Self-pay | Admitting: Hematology

## 2018-11-08 DIAGNOSIS — I2699 Other pulmonary embolism without acute cor pulmonale: Secondary | ICD-10-CM

## 2018-11-08 LAB — HISTOPLASMA ANTIBODIES: Histoplasma Ab, Immunodiffusion: NEGATIVE

## 2018-11-15 ENCOUNTER — Inpatient Hospital Stay: Payer: PRIVATE HEALTH INSURANCE | Admitting: Hematology

## 2018-11-15 ENCOUNTER — Inpatient Hospital Stay: Payer: PRIVATE HEALTH INSURANCE | Attending: Hematology

## 2018-12-06 ENCOUNTER — Encounter: Payer: Self-pay | Admitting: Internal Medicine

## 2018-12-06 ENCOUNTER — Ambulatory Visit (INDEPENDENT_AMBULATORY_CARE_PROVIDER_SITE_OTHER): Payer: PRIVATE HEALTH INSURANCE | Admitting: Internal Medicine

## 2018-12-06 VITALS — BP 132/68 | HR 116 | Temp 99.6°F | Resp 16 | Ht 71.0 in | Wt 281.2 lb

## 2018-12-06 DIAGNOSIS — D682 Hereditary deficiency of other clotting factors: Secondary | ICD-10-CM | POA: Diagnosis not present

## 2018-12-06 DIAGNOSIS — Z7901 Long term (current) use of anticoagulants: Secondary | ICD-10-CM

## 2018-12-06 DIAGNOSIS — K5790 Diverticulosis of intestine, part unspecified, without perforation or abscess without bleeding: Secondary | ICD-10-CM | POA: Diagnosis not present

## 2018-12-06 DIAGNOSIS — K5792 Diverticulitis of intestine, part unspecified, without perforation or abscess without bleeding: Secondary | ICD-10-CM

## 2018-12-06 DIAGNOSIS — I2699 Other pulmonary embolism without acute cor pulmonale: Secondary | ICD-10-CM

## 2018-12-06 LAB — POCT INR: INR: 1.4 — AB (ref 2.0–3.0)

## 2018-12-06 MED ORDER — CIPROFLOXACIN HCL 500 MG PO TABS: 500.0000 mg | ORAL_TABLET | Freq: Two times a day (BID) | ORAL | 0 refills | Status: DC

## 2018-12-06 MED ORDER — METRONIDAZOLE 500 MG PO TABS: 500.0000 mg | ORAL_TABLET | Freq: Three times a day (TID) | ORAL | 0 refills | Status: DC

## 2018-12-06 MED ORDER — APIXABAN 5 MG PO TABS: 5.0000 mg | ORAL_TABLET | Freq: Two times a day (BID) | ORAL | 12 refills | Status: DC

## 2018-12-06 MED FILL — metroNIDAZOLE 500 MG TABS: 500 | 10 days supply | Qty: 30 | Fill #0

## 2018-12-06 MED FILL — CIPROFLOXACIN HCL 500 MG TA: 500 | 10 days supply | Qty: 20 | Fill #0

## 2018-12-06 MED FILL — ELIQUIS 5 MG TABLET: 5 | 30 days supply | Qty: 60 | Fill #0

## 2018-12-06 NOTE — Progress Notes (Signed)
Subjective:    Patient ID: Joshua Blair, male    DOB: [birth year], 53 y.o.   MRN: [id number]  DOS:  12/06/2018 Type of visit - description: acute The patient developed right lower quadrant abdominal pain 3 days ago, the pain is steady, has no move from that location.  Does not change with eating, it decreased a little after a bowel movement. If feels similar to the episode of right-sided diverticulitis he had few months ago. Overall pain is less today than it was 3 days ago.  Also, note from pulmonology reviewed.  Also, he is on Coumadin, has misses some doses and ate some greens.  INR today is 1.4.  Review of Systems Denies fever, some chills with onset of symptoms?. Appetite is normal Denies blood in the stools or vomiting.  Some nausea. No dysuria, gross hematuria or difficulty urinating.  Past Medical History:  Diagnosis Date  . Anxiety   . Chicken pox   . Factor V deficiency ([facility])   . Lower urinary tract symptoms (LUTS)   . Pulmonary emboli [facility](HCC)     Past Surgical History:  Procedure Laterality Date  . cholecystectomy  2002  . COLON SURGERY  2018   to remove benign polyps  . EYE SURGERY Bilateral 2015 rt/ 2017 lt   for detached retina    Social History   Socioeconomic History  . Marital status: Married    Spouse name: Maralyn SagoSarah  . Number of children: 3  . Years of education: Not on file  . Highest education level: Not on file  Occupational History  . Occupation: Product managerndustrial electrician  Social Needs  . Financial resource strain: Not hard at all  . Food insecurity:    Worry: Never true    Inability: Never true  . Transportation needs:    Medical: No    Non-medical: No  Tobacco Use  . Smoking status: Former Smoker    Packs/day: 0.50    Years: 0.50    Pack years: 0.25    Types: Cigarettes    Last attempt to quit: 06/21/2006    Years since quitting: 12.4  . Smokeless tobacco: Never Used  Substance and Sexual Activity  . Alcohol use: Yes    Comment:  rarely  . Drug use: Never  . Sexual activity: Yes    Partners: Female  Lifestyle  . Physical activity:    Days per week: 0 days    Minutes per session: Not on file  . Stress: Not on file  Relationships  . Social connections:    Talks on phone: Not on file    Gets together: Never    Attends religious service: 1 to 4 times per year    Active member of club or organization: No    Attends meetings of clubs or organizations: Never    Relationship status: Married  . Intimate partner violence:    Fear of current or ex partner: No    Emotionally abused: No    Physically abused: No    Forced sexual activity: No  Other Topics Concern  . Not on file  Social History Narrative   Moved to [place] 2019 from [place]       Allergies as of 12/06/2018      Reactions   Augmentin [amoxicillin-pot Clavulanate] Swelling   Penicillins Swelling      Medication List       Accurate as of December 06, 2018 11:59 PM. Always use your most recent med list.  apixaban 5 MG Tabs tablet Commonly known as:  ELIQUIS Take 1 tablet (5 mg total) by mouth 2 (two) times daily.   ciprofloxacin 500 MG tablet Commonly known as:  CIPRO Take 1 tablet (500 mg total) by mouth 2 (two) times daily.   citalopram 20 MG tablet Commonly known as:  CELEXA 1.5 tablets daily for 2 weeks, then 2 tablets daily   metroNIDAZOLE 500 MG tablet Commonly known as:  FLAGYL Take 1 tablet (500 mg total) by mouth 3 (three) times daily.   tamsulosin 0.4 MG Caps capsule Commonly known as:  FLOMAX Take 0.4 mg by mouth daily after supper.   UNABLE TO FIND Med Name: CPAP           Objective:   Physical Exam BP 132/68 (BP Location: Left Arm, Patient Position: Sitting, Cuff Size: Normal)   Pulse (!) 116   Temp 99.6 F (37.6 C) (Oral)   Resp 16   Ht 5\' 11"  (1.803 m)   Wt 281 lb 4 oz (127.6 kg)   SpO2 95%   BMI 39.23 kg/m  General:   Well developed, NAD, BMI noted.  HEENT:  Normocephalic . Face symmetric,  atraumatic Lungs:  CTA B Normal respiratory effort, no intercostal retractions, no accessory muscle use. Heart: RRR,  no murmur.  no pretibial edema bilaterally  Abdomen:  Not distended, soft, he is tender at the right lower quadrant without mass or rebound.  Point of maximal tenderness is slightly distal and lateral from McBurney's. Skin: Not pale. Not jaundice Neurologic:  alert & oriented X3.  Speech normal, gait appropriate for age and unassisted Psych--  Cognition and judgment appear intact.  Cooperative with normal attention span and concentration.  Behavior appropriate. No anxious or depressed appearing.     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 ([place]) --on coumadin GI: ---Diverticulosis w/ diverticulitis 2018, diverticulitis, right-sided again on 05-2018 --Cscopes ~ 3, last 2018, + polyps OSA dx ~ 2017 LUTS Rx flomax ~ 2015 ++ help B retinal detachment  Plan: Diverticulitis: The patient presents with right lower quadrant abdominal pain, similar to when he was sick on 05-2018 and  had documented   "sigmoid diverticulitis in the right lower abdomen" by CT Although one has to think about appendicitis, symptoms started in the RLQ and stay there since and  after 3 days he is feeling slightly better thus appendicitis is much less likely. Plan: UA, urine culture, CBC, BMP.  Start Cipro, Flagyl x10 days.  ER if not gradually better or if symptoms increase.  RTC 4 weeks. Coumadin management: Failed referral to see hematology, I explained the patient that he is at risk to having more clots if he INR is not therapeutic.  He works out of town very frequently, unable to follow a proper diet for Coumadin or f/u for frequent checks. Discussed informally with hematology, will stop Coumadin, start Eliquis 5 mg twice daily.  Risk of heavy bleeding discussed  with patient. Refer again to hematology for a formal discussion. Mediastinal adenopathy: See last visit, CT of the chest was performed,Subsequently he saw pulmonary.  No urgent work-up was needed, they will continue following up.  See office visit. Mild hyperglycemia: A1c 5.2 09/10/2018  RTC 4 to 6 weeks

## 2018-12-06 NOTE — Patient Instructions (Signed)
GO TO THE LAB : Get the blood work     GO TO THE FRONT DESK Schedule your next appointment   checkup in 4 to 6 weeks  Stop Coumadin  Start Eliquis 5 mg: 1 tablet twice a day Watch for excessive bleeding.  We suspect you have diverticulitis. Take Cipro, Flagyl, drink plenty fluids.  Follow a soft/bland diet. If you are not gradually improving or if you have severe symptoms, fever chills: Go to the local ER.

## 2018-12-06 NOTE — Progress Notes (Signed)
Pre visit review using our clinic review tool, if applicable. No additional management support is needed unless otherwise documented below in the visit note. 

## 2018-12-07 DIAGNOSIS — K5792 Diverticulitis of intestine, part unspecified, without perforation or abscess without bleeding: Secondary | ICD-10-CM | POA: Insufficient documentation

## 2018-12-07 LAB — CBC WITH DIFFERENTIAL/PLATELET
Basophils Absolute: 0.1 10*3/uL (ref 0.0–0.1)
Basophils Relative: 0.9 % (ref 0.0–3.0)
Eosinophils Absolute: 0.2 10*3/uL (ref 0.0–0.7)
Eosinophils Relative: 1.9 % (ref 0.0–5.0)
HCT: 44.9 % (ref 39.0–52.0)
Hemoglobin: 15.5 g/dL (ref 13.0–17.0)
Lymphocytes Relative: 9.4 % — ABNORMAL LOW (ref 12.0–46.0)
Lymphs Abs: 1 10*3/uL (ref 0.7–4.0)
MCHC: 34.5 g/dL (ref 30.0–36.0)
MCV: 85.6 fl (ref 78.0–100.0)
Monocytes Absolute: 0.9 10*3/uL (ref 0.1–1.0)
Monocytes Relative: 8.5 % (ref 3.0–12.0)
Neutro Abs: 8.7 10*3/uL — ABNORMAL HIGH (ref 1.4–7.7)
Neutrophils Relative %: 79.3 % — ABNORMAL HIGH (ref 43.0–77.0)
Platelets: 199 10*3/uL (ref 150.0–400.0)
RBC: 5.24 Mil/uL (ref 4.22–5.81)
RDW: 14.1 % (ref 11.5–15.5)
WBC: 11 10*3/uL — ABNORMAL HIGH (ref 4.0–10.5)

## 2018-12-07 LAB — BASIC METABOLIC PANEL
BUN: 22 mg/dL (ref 6–23)
CO2: 26 mEq/L (ref 19–32)
Calcium: 9.4 mg/dL (ref 8.4–10.5)
Chloride: 104 mEq/L (ref 96–112)
Creatinine, Ser: 1.08 mg/dL (ref 0.40–1.50)
GFR: 71.75 mL/min (ref >60.00)
Glucose, Bld: 95 mg/dL (ref 70–99)
POTASSIUM: 4 meq/L (ref 3.5–5.1)
SODIUM: 140 meq/L (ref 135–145)

## 2018-12-07 LAB — URINALYSIS, ROUTINE W REFLEX MICROSCOPIC
Hgb urine dipstick: NEGATIVE
Ketones, ur: 40 — AB
Leukocytes,Ua: NEGATIVE
Nitrite: NEGATIVE
PH: 5.5 (ref 5.0–8.0)
RBC / HPF: NONE SEEN (ref 0–?)
Specific Gravity, Urine: >=1.03 — AB (ref 1.000–1.030)
Total Protein, Urine: NEGATIVE
UROBILINOGEN UA: 0.2 (ref 0.0–1.0)
Urine Glucose: NEGATIVE

## 2018-12-07 LAB — URINE CULTURE
MICRO NUMBER:: 191689
Result:: NO GROWTH
SPECIMEN QUALITY:: ADEQUATE

## 2018-12-07 NOTE — Assessment & Plan Note (Signed)
Diverticulitis: The patient presents with right lower quadrant abdominal pain, similar to when he was sick on 05-2018 and  had documented   "sigmoid diverticulitis in the right lower abdomen" by CT Although one has to think about appendicitis, symptoms started in the RLQ and stay there since and  after 3 days he is feeling slightly better thus appendicitis is much less likely. Plan: UA, urine culture, CBC, BMP.  Start Cipro, Flagyl x10 days.  ER if not gradually better or if symptoms increase.  RTC 4 weeks. Coumadin management: Failed referral to see hematology, I explained the patient that he is at risk to having more clots if he INR is not therapeutic.  He works out of town very frequently, unable to follow a proper diet for Coumadin or f/u for frequent checks. Discussed informally with hematology, will stop Coumadin, start Eliquis 5 mg twice daily.  Risk of heavy bleeding discussed with patient. Refer again to hematology for a formal discussion. Mediastinal adenopathy: See last visit, CT of the chest was performed,Subsequently he saw pulmonary.  No urgent work-up was needed, they will continue following up.  See office visit. Mild hyperglycemia: A1c 5.2 09/10/2018  RTC 4 to 6 weeks

## 2018-12-11 ENCOUNTER — Ambulatory Visit: Payer: PRIVATE HEALTH INSURANCE | Admitting: Internal Medicine

## 2018-12-13 ENCOUNTER — Ambulatory Visit (INDEPENDENT_AMBULATORY_CARE_PROVIDER_SITE_OTHER)
Admission: RE | Admit: 2018-12-13 | Discharge: 2018-12-13 | Disposition: A | Discharge status: Home / Self Care | Payer: PRIVATE HEALTH INSURANCE | Source: Ambulatory Visit | Attending: Internal Medicine | Admitting: Internal Medicine

## 2018-12-13 DIAGNOSIS — R590 Localized enlarged lymph nodes: Secondary | ICD-10-CM

## 2018-12-13 MED ORDER — IOPAMIDOL (ISOVUE-300) INJECTION 61%
80.0000 mL | Freq: Once | INTRAVENOUS | Status: AC | PRN
  Administered 2018-12-13: 80 mL via INTRAVENOUS

## 2019-01-01 ENCOUNTER — Inpatient Hospital Stay: Payer: PRIVATE HEALTH INSURANCE | Admitting: Hematology

## 2019-01-01 ENCOUNTER — Inpatient Hospital Stay: Payer: PRIVATE HEALTH INSURANCE | Attending: Hematology

## 2019-01-07 ENCOUNTER — Ambulatory Visit: Payer: PRIVATE HEALTH INSURANCE | Admitting: Internal Medicine

## 2019-01-11 ENCOUNTER — Ambulatory Visit: Payer: PRIVATE HEALTH INSURANCE | Admitting: Internal Medicine

## 2019-01-11 ENCOUNTER — Other Ambulatory Visit: Payer: Self-pay

## 2019-01-11 ENCOUNTER — Encounter: Payer: Self-pay | Admitting: Internal Medicine

## 2019-01-11 VITALS — BP 126/74 | HR 81 | Temp 98.5°F | Resp 16 | Ht 71.0 in | Wt 277.4 lb

## 2019-01-11 DIAGNOSIS — Z7901 Long term (current) use of anticoagulants: Secondary | ICD-10-CM

## 2019-01-11 DIAGNOSIS — N529 Male erectile dysfunction, unspecified: Secondary | ICD-10-CM | POA: Insufficient documentation

## 2019-01-11 DIAGNOSIS — K5792 Diverticulitis of intestine, part unspecified, without perforation or abscess without bleeding: Secondary | ICD-10-CM

## 2019-01-11 MED ORDER — RIVAROXABAN 20 MG PO TABS: 20.0000 mg | ORAL_TABLET | Freq: Every day | ORAL | 6 refills | Status: DC

## 2019-01-11 MED ORDER — SILDENAFIL CITRATE 20 MG PO TABS: 80.0000 mg | ORAL_TABLET | Freq: Every evening | ORAL | 3 refills | Status: AC | PRN

## 2019-01-11 MED FILL — XARELTO 20 MG TABLET: 20 | 30 days supply | Qty: 30 | Fill #0

## 2019-01-11 MED FILL — SILDENAFIL CITRATE 20 MG TA: 20 | 6 days supply | Qty: 30 | Fill #0

## 2019-01-11 NOTE — Assessment & Plan Note (Signed)
Diverticulitis: Resolved Anticoagulation management: Doing great with Eliquis 5 mg B.I.D. however he will not be able to take that long-term due to cost. We agreed on continuing Eliquis, samples provided.  A prescription for Xarelto and coupon was provided, will see if that is more affordable under his insurance. If he is unable to afford the new anticoagulants, will have to go back on Coumadin.  He is very aware of that and will let me know if that is necessary in the near future. ED: Having problems with erections for years, described erections  as weak and not lasting.  In the past tried Viagra, it did help but again cost was an issue, hopefully this time he will be able to get the generic sildenafil at a reasonable cost.  Rx provided, side effects and how to use discussed. RTC 6 months CPX

## 2019-01-11 NOTE — Patient Instructions (Addendum)
   GO TO THE FRONT DESK Schedule your next appointment   for a physical exam in 6 months, fasting  Take Eliquis 5 mg twice a day or Xarelto 20 mg 1 tablet daily. Do not take both.  If you are unable to afford either one,  please call, will restart Coumadin.

## 2019-01-11 NOTE — Progress Notes (Signed)
Subjective:    Patient ID: Joshua Blair, male    DOB: 06/06/1966, 53 y.o.   MRN: [id number]  DOS:  01/11/2019 Type of visit - description: f/u Diverticulitis: See last visit, took medications as prescribed, he quickly responded to treatment. Anticoagulation: Good compliance with Eliquis so far, no apparent side effects, he won't be able to take this long-term due to cost, more than $400 a month.  Review of Systems  Denies fever chills No abdominal pain, nausea, vomiting.  No blood in the stools.  Past Medical History:  Diagnosis Date  . Anxiety   . Chicken pox   . Factor V deficiency ([facility])   . Lower urinary tract symptoms (LUTS)   . Pulmonary emboli [facility])     Past Surgical History:  Procedure Laterality Date  . cholecystectomy  2002  . COLON SURGERY  2018   to remove benign polyps  . EYE SURGERY Bilateral 2015 rt/ 2017 lt   for detached retina    Social History   Socioeconomic History  . Marital status: Married    Spouse name: Maralyn Sago  . Number of children: 3  . Years of education: Not on file  . Highest education level: Not on file  Occupational History  . Occupation: Product manager  Social Needs  . Financial resource strain: Not hard at all  . Food insecurity:    Worry: Never true    Inability: Never true  . Transportation needs:    Medical: No    Non-medical: No  Tobacco Use  . Smoking status: Former Smoker    Packs/day: 0.50    Years: 0.50    Pack years: 0.25    Types: Cigarettes    Last attempt to quit: 06/21/2006    Years since quitting: 12.5  . Smokeless tobacco: Never Used  Substance and Sexual Activity  . Alcohol use: Yes    Comment: rarely  . Drug use: Never  . Sexual activity: Yes    Partners: Female  Lifestyle  . Physical activity:    Days per week: 0 days    Minutes per session: Not on file  . Stress: Not on file  Relationships  . Social connections:    Talks on phone: Not on file    Gets together: Never    Attends  religious service: 1 to 4 times per year    Active member of club or organization: No    Attends meetings of clubs or organizations: Never    Relationship status: Married  . Intimate partner violence:    Fear of current or ex partner: No    Emotionally abused: No    Physically abused: No    Forced sexual activity: No  Other Topics Concern  . Not on file  Social History Narrative   Moved to [place] 2019 from [place]       Allergies as of 01/11/2019      Reactions   Augmentin [amoxicillin-pot Clavulanate] Swelling   Penicillins Swelling      Medication List       Accurate as of January 11, 2019  3:06 PM. Always use your most recent med list.        apixaban 5 MG Tabs tablet Commonly known as:  Eliquis Take 1 tablet (5 mg total) by mouth 2 (two) times daily.   citalopram 20 MG tablet Commonly known as:  CELEXA 1.5 tablets daily for 2 weeks, then 2 tablets daily   rivaroxaban 20 MG Tabs tablet Commonly known as:  Xarelto Take 1 tablet (20 mg total) by mouth daily with supper.   sildenafil 20 MG tablet Commonly known as:  REVATIO Take 4-5 tablets (80-100 mg total) by mouth at bedtime as needed.   tamsulosin 0.4 MG Caps capsule Commonly known as:  FLOMAX Take 0.4 mg by mouth daily after supper.   UNABLE TO FIND Med Name: CPAP           Objective:   Physical Exam BP 126/74 (BP Location: Left Arm, Patient Position: Sitting, Cuff Size: Normal)   Pulse 81   Temp 98.5 F (36.9 C) (Oral)   Resp 16   Ht 5\' 11"  (1.803 m)   Wt 277 lb 6 oz (125.8 kg)   SpO2 98%   BMI 38.69 kg/m   General:   Well developed, NAD, BMI noted.  HEENT:  Normocephalic . Face symmetric, atraumatic Abdomen:  Not distended, soft, non-tender. No rebound or rigidity.   Skin: Not pale. Not jaundice Neurologic:  alert & oriented X3.  Speech normal, gait appropriate for age and unassisted Psych--  Cognition and judgment appear intact.  Cooperative with normal attention span and  concentration.  Behavior appropriate. No anxious or depressed appearing.     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 however he will not be able to take that long-term due to cost. We agreed on continuing Eliquis, samples provided.  A prescription for Xarelto and coupon was provided, will see if that is more affordable under his insurance. If he is unable to afford the new anticoagulants, will have to go back on Coumadin.  He is very aware of that and will let me know if that is necessary in the near future. ED: Having problems with erections for years, described erections  as weak and not lasting.  In the past tried Viagra, it did help but again cost was an issue, hopefully this time he will be able to get the generic sildenafil at a reasonable cost.  Rx provided, side effects and how to use discussed. RTC 6 months CPX

## 2019-01-11 NOTE — Progress Notes (Signed)
Pre visit review using our clinic review tool, if applicable. No additional management support is needed unless otherwise documented below in the visit note. 

## 2019-05-03 ENCOUNTER — Telehealth: Payer: Self-pay | Admitting: Internal Medicine

## 2019-05-03 MED ORDER — TAMSULOSIN HCL 0.4 MG PO CAPS: 0.4000 mg | ORAL_CAPSULE | Freq: Every day | ORAL | 1 refills | Status: DC

## 2019-05-03 MED ORDER — APIXABAN 5 MG PO TABS: 5.0000 mg | ORAL_TABLET | Freq: Two times a day (BID) | ORAL | 1 refills | Status: DC

## 2019-05-03 MED ORDER — CITALOPRAM HYDROBROMIDE 20 MG PO TABS: 40.0000 mg | ORAL_TABLET | Freq: Every day | ORAL | 1 refills | Status: DC

## 2019-05-03 MED FILL — CITALOPRAM HBR 20 MG TABLET: 20 | 30 days supply | Qty: 60 | Fill #0

## 2019-05-03 MED FILL — ELIQUIS 5 MG TABLET: 5 | 30 days supply | Qty: 60 | Fill #0

## 2019-05-03 MED FILL — TAMSULOSIN HCL 0.4 MG CAP: 0.4 | 30 days supply | Qty: 30 | Fill #0

## 2019-05-03 NOTE — Telephone Encounter (Signed)
Rxs sent

## 2019-05-03 NOTE — Telephone Encounter (Signed)
Pt needing refills on warfarin, celexa, flomax. Approx 1 wk on hand. He is requesting a 90 day supply until his appt in September. Pt travels for work and has had to quarantine a few times and trips have taken longer than normal.   [place], [place] [phone] (Phone) [phone] (Fax)

## 2019-05-10 MED FILL — TAMSULOSIN HCL 0.4 MG CAP: 0.4 | 30 days supply | Qty: 30 | Fill #0

## 2019-05-10 MED FILL — CITALOPRAM HBR 20 MG TABLET: 20 | 30 days supply | Qty: 60 | Fill #0

## 2019-06-25 ENCOUNTER — Other Ambulatory Visit: Payer: Self-pay

## 2019-06-25 ENCOUNTER — Ambulatory Visit: Payer: PRIVATE HEALTH INSURANCE | Admitting: Internal Medicine

## 2019-06-25 ENCOUNTER — Telehealth: Payer: Self-pay | Admitting: Internal Medicine

## 2019-06-25 ENCOUNTER — Encounter: Payer: Self-pay | Admitting: Internal Medicine

## 2019-06-25 ENCOUNTER — Telehealth: Payer: Self-pay

## 2019-06-25 VITALS — BP 138/79 | HR 88 | Temp 97.9°F | Resp 16 | Ht 71.0 in | Wt 285.4 lb

## 2019-06-25 DIAGNOSIS — R590 Localized enlarged lymph nodes: Secondary | ICD-10-CM

## 2019-06-25 DIAGNOSIS — Z23 Encounter for immunization: Secondary | ICD-10-CM

## 2019-06-25 DIAGNOSIS — D682 Hereditary deficiency of other clotting factors: Secondary | ICD-10-CM

## 2019-06-25 DIAGNOSIS — Z7901 Long term (current) use of anticoagulants: Secondary | ICD-10-CM | POA: Diagnosis not present

## 2019-06-25 LAB — POCT INR: INR: 3.7 — AB (ref 2.0–3.0)

## 2019-06-25 MED ORDER — WARFARIN SODIUM 10 MG PO TABS: ORAL_TABLET | ORAL | 0 refills | Status: DC

## 2019-06-25 MED FILL — WARFARIN NA 10 MG TAB: 10 | 90 days supply | Qty: 90 | Fill #0

## 2019-06-25 NOTE — Progress Notes (Signed)
Pre visit review using our clinic review tool, if applicable. No additional management support is needed unless otherwise documented below in the visit note. 

## 2019-06-25 NOTE — Telephone Encounter (Signed)
Per Dr. Larose Kells- set up home INR. MDINR form completed and faxed to [phone]. Form sent for scanning.

## 2019-06-25 NOTE — Progress Notes (Signed)
Subjective:    Patient ID: Joshua Blair, male    DOB: 09/12/1966, 53 y.o.   MRN: [id number]  DOS:  06/25/2019 Type of visit - description: Routine visit 6 weeks ago, he self switch from Eliquis to Coumadin, unable to afford Eliquis anymore. Taking 75 mg weekly.   Review of Systems Denies fever chills No chest pain no difficulty breathing No lower extremity edema No blood in the stools. No blood in the urine.   Past Medical History:  Diagnosis Date  . Anxiety   . Chicken pox   . Factor V deficiency ([place])   . Lower urinary tract symptoms (LUTS)   . Pulmonary emboli [facility])     Past Surgical History:  Procedure Laterality Date  . cholecystectomy  2002  . COLON SURGERY  2018   to remove benign polyps  . EYE SURGERY Bilateral 2015 rt/ 2017 lt   for detached retina    Social History   Socioeconomic History  . Marital status: Married    Spouse name: [name]  . Number of children: 3  . Years of education: Not on file  . Highest education level: Not on file  Occupational History  . Occupation: Programmer, systems  Social Needs  . Financial resource strain: Not hard at all  . Food insecurity    Worry: Never true    Inability: Never true  . Transportation needs    Medical: No    Non-medical: No  Tobacco Use  . Smoking status: Former Smoker    Packs/day: 0.50    Years: 0.50    Pack years: 0.25    Types: Cigarettes    Quit date: 06/21/2006    Years since quitting: 13.0  . Smokeless tobacco: Never Used  Substance and Sexual Activity  . Alcohol use: Yes    Comment: rarely  . Drug use: Never  . Sexual activity: Yes    Partners: Female  Lifestyle  . Physical activity    Days per week: 0 days    Minutes per session: Not on file  . Stress: Not on file  Relationships  . Social Herbalist on phone: Not on file    Gets together: Never    Attends religious service: 1 to 4 times per year    Active member of club or organization: No    Attends meetings  of clubs or organizations: Never    Relationship status: Married  . Intimate partner violence    Fear of current or ex partner: No    Emotionally abused: No    Physically abused: No    Forced sexual activity: No  Other Topics Concern  . Not on file  Social History Narrative   Moved to [place] 2019 from [place] as of 06/25/2019      Reactions   Augmentin [amoxicillin-pot Clavulanate] Swelling   Penicillins Swelling      Medication List       Accurate as of June 25, 2019 11:59 PM. If you have any questions, ask your nurse or doctor.        STOP taking these medications   apixaban 5 MG Tabs tablet Commonly known as: Eliquis Stopped by: Kathlene November, MD   rivaroxaban 20 MG Tabs tablet Commonly known as: Xarelto Stopped by: Kathlene November, MD     TAKE these medications   citalopram 20 MG tablet Commonly known as: CELEXA Take 2 tablets (40 mg total) by mouth daily.  sildenafil 20 MG tablet Commonly known as: REVATIO Take 4-5 tablets (80-100 mg total) by mouth at bedtime as needed.   tamsulosin 0.4 MG Caps capsule Commonly known as: FLOMAX Take 1 capsule (0.4 mg total) by mouth daily after supper.   UNABLE TO FIND Med Name: CPAP   warfarin 10 MG tablet Commonly known as: COUMADIN Take as directed by the anticoagulation clinic. If you are unsure how to take this medication, talk to your nurse or doctor. Original instructions: TAKE AS DIRECTED BY COUMADIN CLINIC           Objective:   Physical Exam BP 138/79 (BP Location: Right Arm, Patient Position: Sitting, Cuff Size: Normal)   Pulse 88   Temp 97.9 F (36.6 C) (Temporal)   Resp 16   Ht 5\' 11"  (1.803 m)   Wt 285 lb 6 oz (129.4 kg)   SpO2 96%   BMI 39.80 kg/m   General:   Well developed, NAD, BMI noted. HEENT:  Normocephalic . Face symmetric, atraumatic Lungs:  CTA B Normal respiratory effort, no intercostal retractions, no accessory muscle use. Heart: RRR,  no murmur.  No pretibial  edema bilaterally  Skin: Not pale. Not jaundice Neurologic:  alert & oriented X3.  Speech normal, gait appropriate for age and unassisted Psych--  Cognition and judgment appear intact.  Cooperative with normal attention span and concentration.  Behavior appropriate. No anxious or depressed appearing.      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   Advised patient changing therapies like that is not ideal, he could easily be under or over treated and at risk of bleeding or a clot. Nevertheless, will have to work with what he is able to afford. Current dose of Coumadin 10 mg daily except once a week he takes 15 mg (75 mg weekly) New dose: 10 mg daily except once a day to take 5 mg (65 mg weekly) He would like to have his on Coumadin machine, I sent a form noting the patient is leaving town tomorrow so I am not sure if he will be able to get device on time. Next Coumadin check in 2 weeks, options: Urgent care, get his own machine or use her sister's machine. Advised patient is extremely important that I get the results and he gets an answer from my office; if he does not get an answer, he needs to call again.   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  We will try to  refer again when possible.  Today, I spent more than 25 min with the patient: >50% of the time counseling regards the importance of appropriate anticoagulation, reviewing the chart and notes from other providers.  Coordinating his care.

## 2019-06-25 NOTE — Patient Instructions (Addendum)
Coumadin 10 mg: 1 tablet daily Except once a week  only 1/2 tablet  Next Coumadin check is in 2 weeks Options: Local urgent care Use your sister's machine Get your machine  Be sure you call and communicate me your results

## 2019-06-25 NOTE — Telephone Encounter (Signed)
Please schedule appt w/ PCP for f/u and INR. TY.

## 2019-06-25 NOTE — Telephone Encounter (Deleted)
Please advise 

## 2019-06-25 NOTE — Telephone Encounter (Signed)
9/1 8:09am - pt called stating he does not take Xarelto due to cost and hasn't for months ($495/month). Pt said that he had been taking Eliquis samples for a month but also high cost. He states he's been taking warfarin about a month again.. Pt is going out of tomorrow and won't be back for weeks. He is needing an order for PT/INR check and asking to come in today.   Copied from [place] [id number]. Topic: General - Other >> Jun 24, 2019 12:24 PM Leward Quan A wrote: Reason for CRM: [name] would like a call back to schedule for a Pt INR blood test asking for a call back at Ph# [phone] >> Jun 24, 2019 12:30 PM Damita Dunnings, CMA wrote: Pt on Xarelto- doesn't need to check INRs.

## 2019-06-26 NOTE — Assessment & Plan Note (Signed)
Anticoagulation management: Unable to afford Eliquis, put himself back on Coumadin 6 weeks ago.   Advised patient changing therapies like that is not ideal, he could easily be under or over treated and at risk of bleeding or a clot. Nevertheless, will have to work with what he is able to afford. Current dose of Coumadin 10 mg daily except once a week he takes 15 mg (75 mg weekly) New dose: 10 mg daily except once a day to take 5 mg (65 mg weekly) He would like to have his on Coumadin machine, I sent a form noting the patient is leaving town tomorrow so I am not sure if he will be able to get device on time. Next Coumadin check in 2 weeks, options: Urgent care, get his own machine or use her sister's machine. Advised patient is extremely important that I get the results and he gets an answer from my office; if he does not get an answer, he needs to call again.   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  We will try to refer again when possible.

## 2019-07-09 ENCOUNTER — Ambulatory Visit (INDEPENDENT_AMBULATORY_CARE_PROVIDER_SITE_OTHER): Payer: PRIVATE HEALTH INSURANCE | Admitting: Internal Medicine

## 2019-07-09 DIAGNOSIS — Z7901 Long term (current) use of anticoagulants: Secondary | ICD-10-CM | POA: Diagnosis not present

## 2019-07-09 DIAGNOSIS — Z5181 Encounter for therapeutic drug level monitoring: Secondary | ICD-10-CM | POA: Diagnosis not present

## 2019-07-09 NOTE — Progress Notes (Signed)
Subjective:    Patient ID: Joshua Blair, male    DOB: 02/22/1966, 53 y.o.   MRN: [id number]  DOS:  07/09/2019 Type of visit - description: Attempted  to make this a video visit, due to technical difficulties from the patient side it was not possible  thus we proceeded with a Virtual Visit via Telephone    I connected with@   by telephone and verified that I am speaking with the correct person using two identifiers.  THIS ENCOUNTER IS A VIRTUAL VISIT DUE TO COVID-19 - PATIENT WAS NOT SEEN IN THE OFFICE. PATIENT HAS CONSENTED TO VIRTUAL VISIT / TELEMEDICINE VISIT   Location of patient: home  Location of provider: office  I discussed the limitations, risks, security and privacy concerns of performing an evaluation and management service by telephone and the availability of in person appointments. I also discussed with the patient that there may be a patient responsible charge related to this service. The patient expressed understanding and agreed to proceed.   History of Present Illness:   Follow-up Patient is here for Coumadin management: Reports good compliance with Coumadin. Has not been able so far to get an INR, today while we were talking he attempted to check his INR with  his sister's machine but he could not get a reading.   Review of Systems  Denies chest pain no difficulty breathing No nausea, vomiting or blood in the stools.  Occasional diarrhea   Past Medical History:  Diagnosis Date  . Anxiety   . Chicken pox   . Factor V deficiency ([place])   . Lower urinary tract symptoms (LUTS)   . Pulmonary emboli [facility])     Past Surgical History:  Procedure Laterality Date  . cholecystectomy  2002  . COLON SURGERY  2018   to remove benign polyps  . EYE SURGERY Bilateral 2015 rt/ 2017 lt   for detached retina    Social History   Socioeconomic History  . Marital status: Married    Spouse name: [name]  . Number of children: 3  . Years of education: Not on file  .  Highest education level: Not on file  Occupational History  . Occupation: Programmer, systems  Social Needs  . Financial resource strain: Not hard at all  . Food insecurity    Worry: Never true    Inability: Never true  . Transportation needs    Medical: No    Non-medical: No  Tobacco Use  . Smoking status: Former Smoker    Packs/day: 0.50    Years: 0.50    Pack years: 0.25    Types: Cigarettes    Quit date: 06/21/2006    Years since quitting: 13.0  . Smokeless tobacco: Never Used  Substance and Sexual Activity  . Alcohol use: Yes    Comment: rarely  . Drug use: Never  . Sexual activity: Yes    Partners: Female  Lifestyle  . Physical activity    Days per week: 0 days    Minutes per session: Not on file  . Stress: Not on file  Relationships  . Social Herbalist on phone: Not on file    Gets together: Never    Attends religious service: 1 to 4 times per year    Active member of club or organization: No    Attends meetings of clubs or organizations: Never    Relationship status: Married  . Intimate partner violence    Fear of current or  ex partner: No    Emotionally abused: No    Physically abused: No    Forced sexual activity: No  Other Topics Concern  . Not on file  Social History Narrative   Moved to [place] 2019 from [place]       Allergies as of 07/09/2019      Reactions   Augmentin [amoxicillin-pot Clavulanate] Swelling   Penicillins Swelling      Medication List       Accurate as of July 09, 2019  9:23 AM. If you have any questions, ask your nurse or doctor.        citalopram 20 MG tablet Commonly known as: CELEXA Take 2 tablets (40 mg total) by mouth daily.   sildenafil 20 MG tablet Commonly known as: REVATIO Take 4-5 tablets (80-100 mg total) by mouth at bedtime as needed.   tamsulosin 0.4 MG Caps capsule Commonly known as: FLOMAX Take 1 capsule (0.4 mg total) by mouth daily after supper.   UNABLE TO FIND Med Name: CPAP    warfarin 10 MG tablet Commonly known as: COUMADIN Take as directed by the anticoagulation clinic. If you are unsure how to take this medication, talk to your nurse or doctor. Original instructions: TAKE AS DIRECTED BY COUMADIN CLINIC           Objective:   Physical Exam There were no vitals taken for this visit. We attempted a video visit, it worked temporarily, due to technical difficulties with switch to phone visit.  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  No apparent complications. We are trying to get him his own Coumadin device but we have not been able to do that so far because he travels for work.  He has not been able to check with his sister's machine. Plan: We will go to urgent care and fax the results of an INR to me.   I discussed the assessment and treatment plan with the patient. The patient was provided an opportunity to ask questions and all were answered. The patient agreed with the plan and demonstrated an understanding of the instructions.   The patient was advised to call back or seek an in-person evaluation if the symptoms worsen or if the condition fails to improve as anticipated.  I provided 15 minutes of non-face-to-face time during this encounter.  Willow OraJose Kerri Kovacik, MD

## 2019-07-10 NOTE — Assessment & Plan Note (Signed)
Anticoagulation management: Currently on Coumadin 10 mg except 1 time a week he takes 5 mg.  No apparent complications. We are trying to get him his own Coumadin device but we have not been able to do that so far because he travels for work.  He has not been able to check with his sister's machine. Plan: We will go to urgent care and fax the results of an INR to me.

## 2019-07-11 NOTE — Telephone Encounter (Signed)
Spoke w/ Renee at [facility] (telephone [phone])- they received order and was entered into their systems today- can take 5-7 business days for insurance to respond.

## 2019-08-01 ENCOUNTER — Telehealth: Payer: Self-pay

## 2019-08-01 ENCOUNTER — Telehealth: Payer: Self-pay | Admitting: Family

## 2019-08-01 ENCOUNTER — Ambulatory Visit: Payer: PRIVATE HEALTH INSURANCE | Admitting: Internal Medicine

## 2019-08-01 ENCOUNTER — Other Ambulatory Visit: Payer: Self-pay

## 2019-08-01 ENCOUNTER — Encounter: Payer: Self-pay | Admitting: Internal Medicine

## 2019-08-01 VITALS — BP 126/60 | HR 90 | Temp 95.2°F | Resp 16 | Ht 71.0 in | Wt 287.0 lb

## 2019-08-01 DIAGNOSIS — Z7901 Long term (current) use of anticoagulants: Secondary | ICD-10-CM

## 2019-08-01 DIAGNOSIS — R590 Localized enlarged lymph nodes: Secondary | ICD-10-CM

## 2019-08-01 DIAGNOSIS — D682 Hereditary deficiency of other clotting factors: Secondary | ICD-10-CM

## 2019-08-01 LAB — POCT INR: INR: 1.2 — AB (ref 2.0–3.0)

## 2019-08-01 MED ORDER — CITALOPRAM HYDROBROMIDE 20 MG PO TABS: 40.0000 mg | ORAL_TABLET | Freq: Every day | ORAL | 1 refills | Status: AC

## 2019-08-01 MED ORDER — TAMSULOSIN HCL 0.4 MG PO CAPS: 0.4000 mg | ORAL_CAPSULE | Freq: Every day | ORAL | 1 refills | Status: AC

## 2019-08-01 MED ORDER — WARFARIN SODIUM 10 MG PO TABS: ORAL_TABLET | ORAL | 0 refills | Status: AC

## 2019-08-01 MED FILL — WARFARIN NA 10 MG TAB: 10 | 90 days supply | Qty: 90 | Fill #0

## 2019-08-01 MED FILL — TAMSULOSIN HCL 0.4 MG CAP: 0.4 | 90 days supply | Qty: 90 | Fill #0

## 2019-08-01 MED FILL — CITALOPRAM HBR 20 MG TABLET: 20 | 90 days supply | Qty: 180 | Fill #0

## 2019-08-01 NOTE — Progress Notes (Signed)
Subjective:    Patient ID: Joshua Blair, male    DOB: 1966-01-08, 53 y.o.   MRN: [id number]  DOS:  08/01/2019 Type of visit - description: Follow-up Coumadin management, INR today not therapeutic, he missed a dose few days ago. Mediastinal adenopathy: Chart reviewed Tick bites: Had several over the last few months . Has  3 places where he still itch from time to time.    Review of Systems No fever chills No chest pain, difficulty breathing no lower extremity edema No blood in the stool or in the urine   Past Medical History:  Diagnosis Date  . Anxiety   . Chicken pox   . Factor V deficiency ([facility])   . Lower urinary tract symptoms (LUTS)   . Pulmonary emboli [facility])     Past Surgical History:  Procedure Laterality Date  . cholecystectomy  2002  . COLON SURGERY  2018   to remove benign polyps  . EYE SURGERY Bilateral 2015 rt/ 2017 lt   for detached retina    Social History   Socioeconomic History  . Marital status: Married    Spouse name: Maralyn Sago  . Number of children: 3  . Years of education: Not on file  . Highest education level: Not on file  Occupational History  . Occupation: Product manager  Social Needs  . Financial resource strain: Not hard at all  . Food insecurity    Worry: Never true    Inability: Never true  . Transportation needs    Medical: No    Non-medical: No  Tobacco Use  . Smoking status: Former Smoker    Packs/day: 0.50    Years: 0.50    Pack years: 0.25    Types: Cigarettes    Quit date: 06/21/2006    Years since quitting: 13.1  . Smokeless tobacco: Never Used  Substance and Sexual Activity  . Alcohol use: Yes    Comment: rarely  . Drug use: Never  . Sexual activity: Yes    Partners: Female  Lifestyle  . Physical activity    Days per week: 0 days    Minutes per session: Not on file  . Stress: Not on file  Relationships  . Social Musician on phone: Not on file    Gets together: Never    Attends religious  service: 1 to 4 times per year    Active member of club or organization: No    Attends meetings of clubs or organizations: Never    Relationship status: Married  . Intimate partner violence    Fear of current or ex partner: No    Emotionally abused: No    Physically abused: No    Forced sexual activity: No  Other Topics Concern  . Not on file  Social History Narrative   Moved to [place] 2019 from [place]       Allergies as of 08/01/2019      Reactions   Augmentin [amoxicillin-pot Clavulanate] Swelling   Penicillins Swelling      Medication List       Accurate as of August 01, 2019 10:12 AM. If you have any questions, ask your nurse or doctor.        citalopram 20 MG tablet Commonly known as: CELEXA Take 2 tablets (40 mg total) by mouth daily.   sildenafil 20 MG tablet Commonly known as: REVATIO Take 4-5 tablets (80-100 mg total) by mouth at bedtime as needed.   tamsulosin 0.4 MG Caps  capsule Commonly known as: FLOMAX Take 1 capsule (0.4 mg total) by mouth daily after supper.   UNABLE TO FIND Med Name: CPAP   warfarin 10 MG tablet Commonly known as: COUMADIN Take as directed by the anticoagulation clinic. If you are unsure how to take this medication, talk to your nurse or doctor. Original instructions: TAKE AS DIRECTED BY COUMADIN CLINIC           Objective:   Physical Exam BP 126/60 (BP Location: Left Arm, Patient Position: Sitting, Cuff Size: Normal)   Pulse 90   Temp (!) 95.2 F (35.1 C) (Temporal)   Resp 16   Ht 5\' 11"  (1.803 m)   Wt 287 lb (130.2 kg)   SpO2 98%   BMI 40.03 kg/m  General:   Well developed, NAD, BMI noted. HEENT:  Normocephalic . Face symmetric, atraumatic Lungs:  CTA B Normal respiratory effort, no intercostal retractions, no accessory muscle use. Heart: RRR,  no murmur.  No pretibial edema bilaterally  Skin: At the right leg, he is showing me the spots where he had a tick bite several weeks ago, they look like simply scabs  Neurologic:  alert & oriented X3.  Speech normal, gait appropriate for age and unassisted Psych--  Cognition and judgment appear intact.  Cooperative with normal attention span and concentration.  Behavior appropriate. No anxious or depressed appearing.      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  He forgot his Coumadin dose 4 days ago, has been eating more salads in the last few days. INR today 1.2. Again reports he is unable to afford Eliquis Plan: Coumadin 10 mg daily INR in 2 weeks.  Strongly recommend to proceed.  He could do with urgent care and fax me the results.  From our side were trying to get him a  Coumadin machine. Skin lesions: Scabs from previous tick bite, recommend OTC hydrocortisone Mediastinal  adenopathy: Saw pulmonary 11/05/2018, subsequently had a CT follow-up 12/13/2018: Was recommended to see oncology, possibly need PET scan vs.EBUS vs. watchful waiting.  Has not proceed with further work-up.  Will arrange a referral to hematology Discussed importance to follow-up with advised Plan: INR in 2 weeks Follow-up in 3 months

## 2019-08-01 NOTE — Progress Notes (Signed)
Pre visit review using our clinic review tool, if applicable. No additional management support is needed unless otherwise documented below in the visit note. 

## 2019-08-01 NOTE — Telephone Encounter (Signed)
Spoke w/ Carney Bern at [facility] at [phone] (provider line)- inquiring about order status- Pt's insurance plan does not have DME coverage. Per Dr. Larose Kells Pt may be interested in paying out of pocket for machine. Will give him patient telephone line to call. He can contact them at [phone].   Spoke w/ Pt- informed of above- he is driving currently- he will be setting up mychart tonight and requests that I send [place] telephone number to him---> will do.

## 2019-08-01 NOTE — Patient Instructions (Addendum)
   GO TO THE FRONT DESK Schedule your next appointment   for a checkup with me in 3 months  Coumadin 10 mg every day  We need to see an INR in 2 weeks  We are referring you back to hematology

## 2019-08-01 NOTE — Telephone Encounter (Signed)
Called and spoke with patient regarding appointments added/ New Patient letter/Map/Calendar has been mailed to him as well

## 2019-08-02 NOTE — Assessment & Plan Note (Signed)
Anticoagulation management: Currently on Coumadin 10 mg except Wednesdays he takes 5 mg.  He forgot his Coumadin dose 4 days ago, has been eating more salads in the last few days. INR today 1.2. Again reports he is unable to afford Eliquis Plan: Coumadin 10 mg daily INR in 2 weeks.  Strongly recommend to proceed.  He could do with urgent care and fax me the results.  From our side were trying to get him a  Coumadin machine. Skin lesions: Scabs from previous tick bite, recommend OTC hydrocortisone Mediastinal  adenopathy: Saw pulmonary 11/05/2018, subsequently had a CT follow-up 12/13/2018: Was recommended to see oncology, possibly need PET scan vs.EBUS vs. watchful waiting.  Has not proceed with further work-up.  Will arrange a referral to hematology Discussed importance to follow-up with advised Plan: INR in 2 weeks Follow-up in 3 months

## 2019-08-19 ENCOUNTER — Other Ambulatory Visit: Payer: PRIVATE HEALTH INSURANCE

## 2019-08-19 ENCOUNTER — Ambulatory Visit: Payer: PRIVATE HEALTH INSURANCE | Admitting: Family

## 2019-08-30 ENCOUNTER — Other Ambulatory Visit: Payer: Self-pay | Admitting: Family

## 2019-08-30 DIAGNOSIS — I2699 Other pulmonary embolism without acute cor pulmonale: Secondary | ICD-10-CM

## 2019-09-02 ENCOUNTER — Ambulatory Visit: Payer: PRIVATE HEALTH INSURANCE | Admitting: Family

## 2019-09-02 ENCOUNTER — Inpatient Hospital Stay: Payer: PRIVATE HEALTH INSURANCE | Attending: Family

## 2019-09-05 ENCOUNTER — Ambulatory Visit (INDEPENDENT_AMBULATORY_CARE_PROVIDER_SITE_OTHER): Payer: PRIVATE HEALTH INSURANCE | Admitting: Internal Medicine

## 2019-09-05 ENCOUNTER — Telehealth: Payer: Self-pay | Admitting: Internal Medicine

## 2019-09-05 ENCOUNTER — Encounter: Payer: Self-pay | Admitting: Internal Medicine

## 2019-09-05 DIAGNOSIS — Z5181 Encounter for therapeutic drug level monitoring: Secondary | ICD-10-CM

## 2019-09-05 DIAGNOSIS — F4321 Adjustment disorder with depressed mood: Secondary | ICD-10-CM

## 2019-09-05 DIAGNOSIS — Z7901 Long term (current) use of anticoagulants: Secondary | ICD-10-CM

## 2019-09-05 MED ORDER — CLONAZEPAM 0.5 MG PO TABS: 0.5000 mg | ORAL_TABLET | Freq: Three times a day (TID) | ORAL | 0 refills | Status: AC | PRN

## 2019-09-05 NOTE — Telephone Encounter (Signed)
Called, LMOM.

## 2019-09-05 NOTE — Telephone Encounter (Signed)
Patient seen virtually today  

## 2019-09-05 NOTE — Telephone Encounter (Signed)
Spoke with patient and he stated that his eldest son passed away unexpectedly in [place] on 08/14/19, he was in the [employer].  He was buried on 08/30/19 in [place].  Patient went down to [place] for funeral and talked with grief counselors and they said to contact PCP for help to get thru this hard time.    Patient scheduled today for doxy today.  He his in [place].

## 2019-09-05 NOTE — Progress Notes (Signed)
Subjective:    Patient ID: Joshua Blair, male    DOB: Feb 17, 1966, 53 y.o.   MRN: [id number]  DOS:  09/05/2019 Type of visit - description:  Attempted  to make this a video visit, due to technical difficulties from the patient side it was not possible  thus we proceeded with a Virtual Visit via Telephone    I connected with@   by telephone and verified that I am speaking with the correct person using two identifiers.  THIS ENCOUNTER IS A VIRTUAL VISIT DUE TO COVID-19 - PATIENT WAS NOT SEEN IN THE OFFICE. PATIENT HAS CONSENTED TO VIRTUAL VISIT / TELEMEDICINE VISIT   Location of patient: home  Location of provider: office  I discussed the limitations, risks, security and privacy concerns of performing an evaluation and management service by telephone and the availability of in person appointments. I also discussed with the patient that there may be a patient responsible charge related to this service. The patient expressed understanding and agreed to proceed.   History of Present Illness: Acute On 09/13/2019, his 55 year old son who was in the [place] committed suicide while stationed in [place].  He is left a wife and 3 children.  Joshua Blair was in [place] and obviously  had to  go to [place] for the arrangements. It is only 4 days ago that he went back to [place] where he his currently working, he is together with his wife.  He has been getting some support from the [facility] but he was recommended to contact the doctor about the situation.  He is obviously overwhelmed by sadness and stress. He is trying to keeping himself busy around the house , he is currently not working, taking several days off. Fortunately denies any suicidal ideas himself He is sleeping okay using a CPAP When he is not distracted he feels very upset, starts crying and trembling.    Review of Systems As above  Past Medical History:  Diagnosis Date  . Anxiety   . Chicken pox   . Factor V deficiency ([place])   .  Lower urinary tract symptoms (LUTS)   . Pulmonary emboli [facility])     Past Surgical History:  Procedure Laterality Date  . cholecystectomy  2002  . COLON SURGERY  2018   to remove benign polyps  . EYE SURGERY Bilateral 2015 rt/ 2017 lt   for detached retina    Social History   Socioeconomic History  . Marital status: Married    Spouse name: Joshua Blair  . Number of children: 3  . Years of education: Not on file  . Highest education level: Not on file  Occupational History  . Occupation: Programmer, systems  Social Needs  . Financial resource strain: Not hard at all  . Food insecurity    Worry: Never true    Inability: Never true  . Transportation needs    Medical: No    Non-medical: No  Tobacco Use  . Smoking status: Former Smoker    Packs/day: 0.50    Years: 0.50    Pack years: 0.25    Types: Cigarettes    Quit date: 06/21/2006    Years since quitting: 13.2  . Smokeless tobacco: Never Used  Substance and Sexual Activity  . Alcohol use: Yes    Comment: rarely  . Drug use: Never  . Sexual activity: Yes    Partners: Female  Lifestyle  . Physical activity    Days per week: 0 days    Minutes per session:  Not on file  . Stress: Not on file  Relationships  . Social Musicianconnections    Talks on phone: Not on file    Gets together: Never    Attends religious service: 1 to 4 times per year    Active member of club or organization: No    Attends meetings of clubs or organizations: Never    Relationship status: Married  . Intimate partner violence    Fear of current or ex partner: No    Emotionally abused: No    Physically abused: No    Forced sexual activity: No  Other Topics Concern  . Not on file  Social History Narrative   Moved to [place] 2019 from [place]    Lost his 53 year old son to suicide 08-2019      Allergies as of 09/05/2019      Reactions   Augmentin [amoxicillin-pot Clavulanate] Swelling   Penicillins Swelling      Medication List       Accurate  as of September 05, 2019 11:59 PM. If you have any questions, ask your nurse or doctor.        citalopram 20 MG tablet Commonly known as: CELEXA Take 2 tablets (40 mg total) by mouth daily.   clonazePAM 0.5 MG tablet Commonly known as: KLONOPIN Take 1 tablet (0.5 mg total) by mouth 3 (three) times daily as needed for anxiety. Started by: Willow OraJose Zeya Balles, MD   sildenafil 20 MG tablet Commonly known as: REVATIO Take 4-5 tablets (80-100 mg total) by mouth at bedtime as needed.   tamsulosin 0.4 MG Caps capsule Commonly known as: FLOMAX Take 1 capsule (0.4 mg total) by mouth daily after supper.   UNABLE TO FIND Med Name: CPAP   warfarin 10 MG tablet Commonly known as: COUMADIN Take as directed by the anticoagulation clinic. If you are unsure how to take this medication, talk to your nurse or doctor. Original instructions: TAKE AS DIRECTED BY COUMADIN CLINIC           Objective:   Physical Exam There were no vitals taken for this visit. This is a virtual phone evaluation, despite his loss, he sounded calm and collected.  Alert oriented x3.    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 We talk about the grieving process, strongly encouraged him to see a counselor.  Fortunately, his wife is w/ him in  [place]. We talk about possibly change citalopram but he think he just needs something temporarily to help with stress. We agreed on Klonopin 0.5 mg 3 times daily, Rx sent I recommend another visit in 2 to 3 weeks, sooner  if needed. Anticoagulation management: Patient reports he check his INR last week: 2.7.  Continue same Coumadin dose RTC 2 to 3 weeks, will call and set up   Today, I spent more than 25   min with the patient: >50% of the time counseling regards the recent events in his life, providing support and listening therapy.   I discussed the assessment and treatment  plan with the patient. The patient was provided an opportunity to ask questions and all were answered. The patient agreed with the plan and demonstrated an understanding of the instructions.   The patient was advised to call back or seek an in-person evaluation if the symptoms worsen or if the condition fails to improve as anticipated.  I provided 25  minutes of non-face-to-face time during this encounter.  Willow Ora, MD

## 2019-09-05 NOTE — Telephone Encounter (Signed)
Copied from [place] [phone]. Topic: General - Other >> Sep 05, 2019 10:16 AM Keene Breath wrote: Reason for CRM: Patient called to speak with the nurse or doctor regarding a recent family tragedy.  He stated that he is currently in [place] and would like a call back at [phone]

## 2019-09-07 ENCOUNTER — Encounter: Payer: Self-pay | Admitting: Internal Medicine

## 2019-09-07 NOTE — Assessment & Plan Note (Signed)
Grieving: Recently lost her son to suicide, obviously distressed. We talk about the grieving process, strongly encouraged him to see a counselor.  Fortunately, his wife is w/ him in  [place]. We talk about possibly change citalopram but he think he just needs something temporarily to help with stress. We agreed on Klonopin 0.5 mg 3 times daily, Rx sent I recommend another visit in 2 to 3 weeks, sooner if needed. Anticoagulation management: Patient reports he check his INR last week: 2.7.  Continue same Coumadin dose RTC 2 to 3 weeks, will call and set up

## 2019-10-30 ENCOUNTER — Ambulatory Visit: Payer: PRIVATE HEALTH INSURANCE | Admitting: Internal Medicine

## 2020-03-08 ENCOUNTER — Other Ambulatory Visit: Payer: Self-pay | Admitting: Internal Medicine

## 2020-03-23 IMAGING — CT CT CHEST W/O CM
2 of 3 series · 15 of 36 positions shown, 18 images · non-contrast
Comparison: None available currently.

CLINICAL DATA: Abnormal CT scan.

EXAM:
CT CHEST WITHOUT CONTRAST
TECHNIQUE: Multidetector CT imaging of the chest was performed following the
standard protocol without IV contrast.

[Series 2: thorax · axial · 0.98mm/px · z∈[-134,+130]mm · 12 of 156 slices shown, 15 images]
[im 12/156  mediastinal]
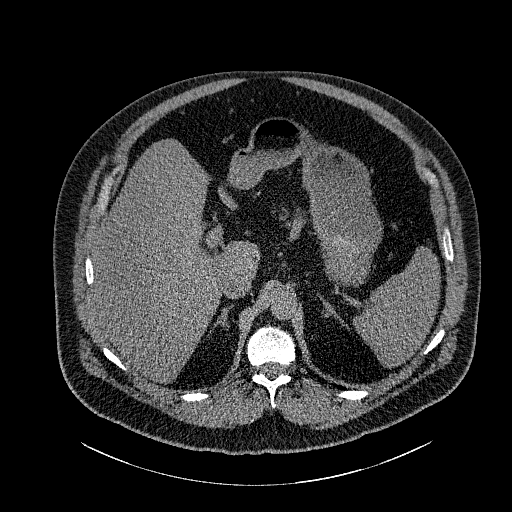
[im 12/156  lung]
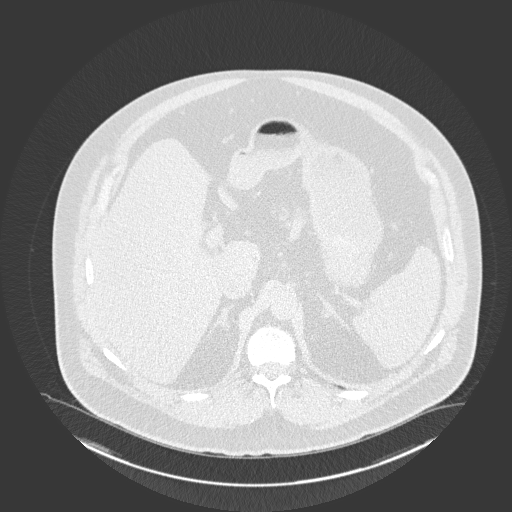
[im 23/156  lung]
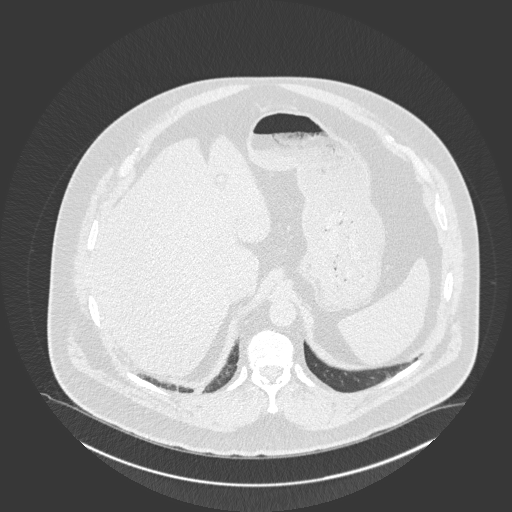
[im 35/156  lung]
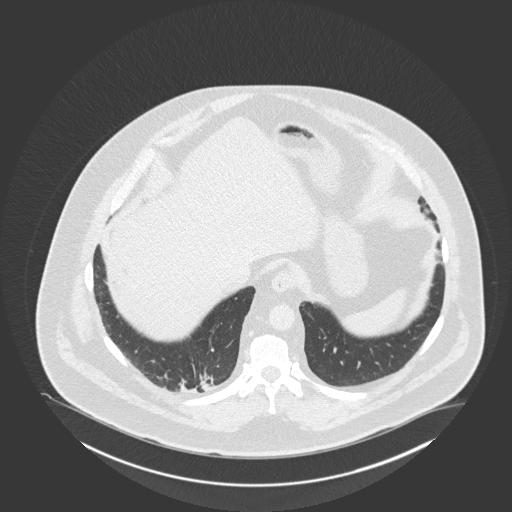
[im 46/156  lung]
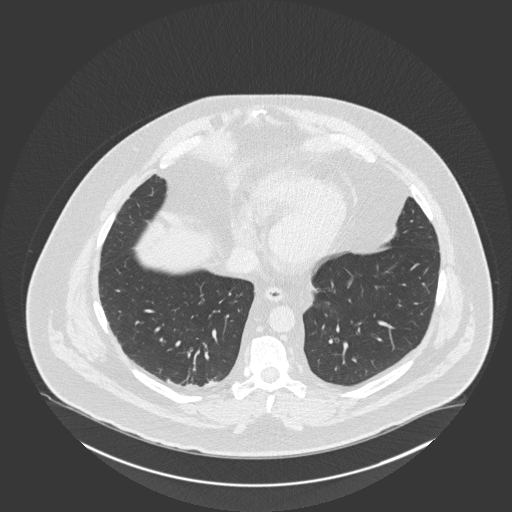
[im 58/156  mediastinal]
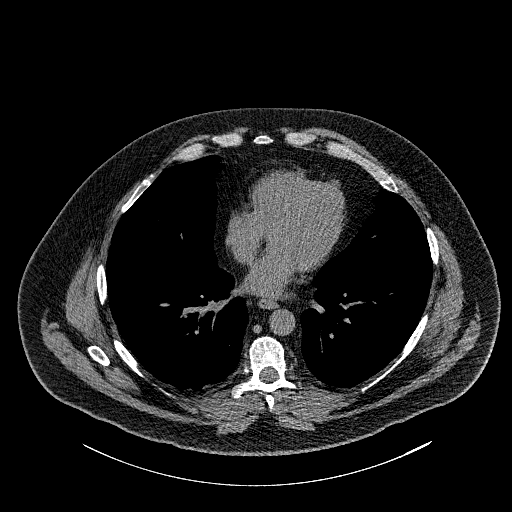
[im 58/156  lung]
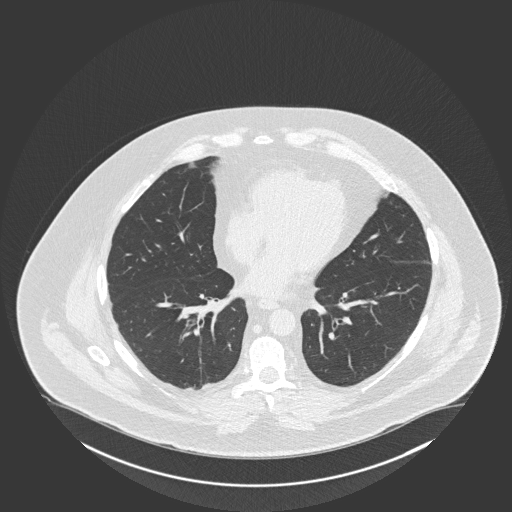
[im 69/156  lung]
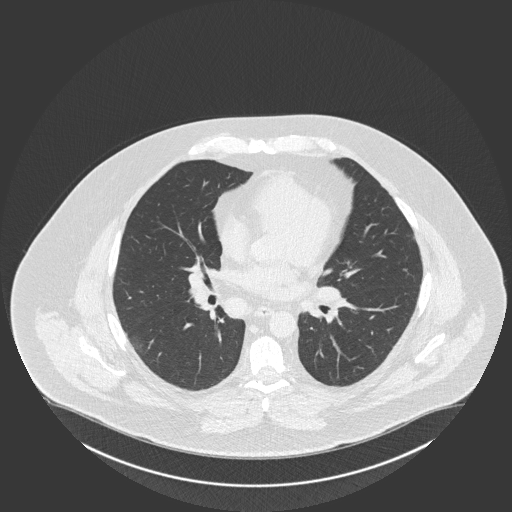
[im 87/156  lung]
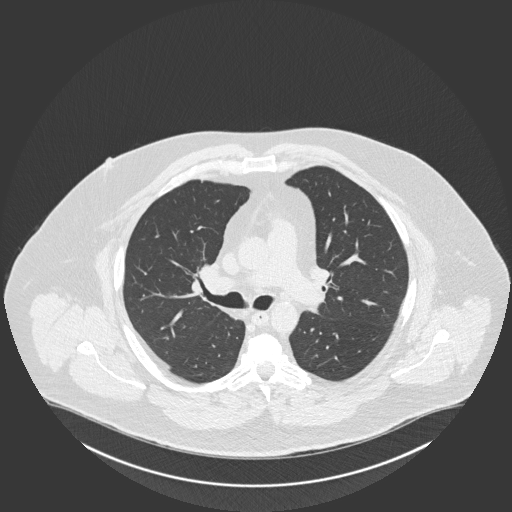
[im 98/156  lung]
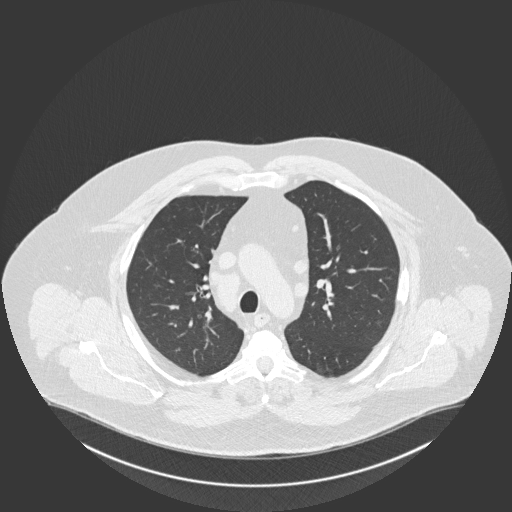
[im 110/156  mediastinal]
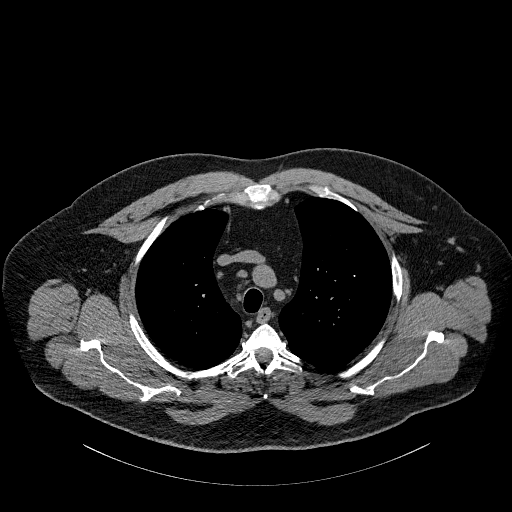
[im 110/156  lung]
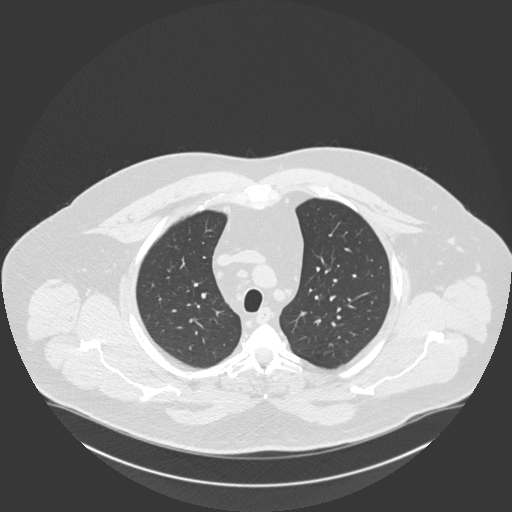
[im 121/156  lung]
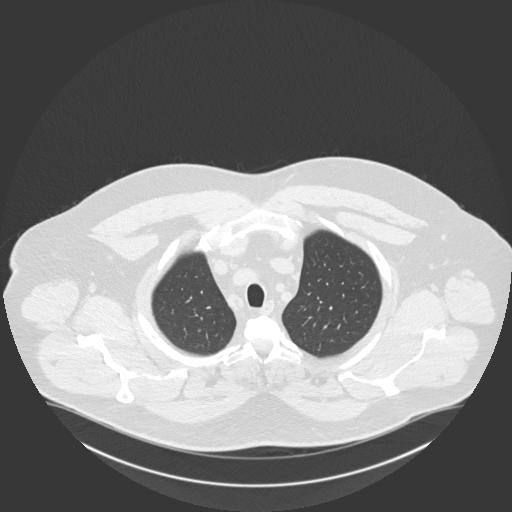
[im 133/156  lung]
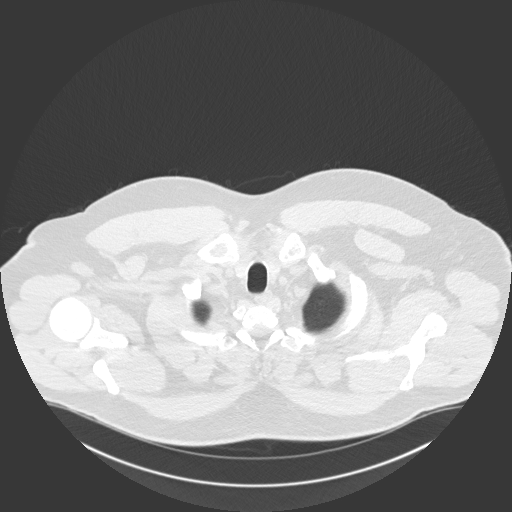
[im 144/156  lung]
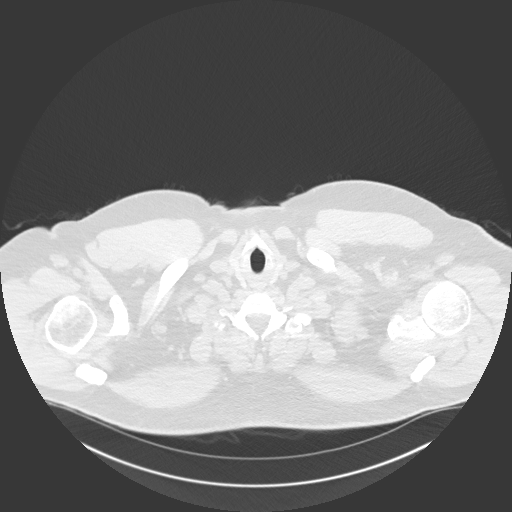

[Series 5: coronal · coronal · 0.63mm/px · 3 of 160 slices shown]
[im 32/160  lung]
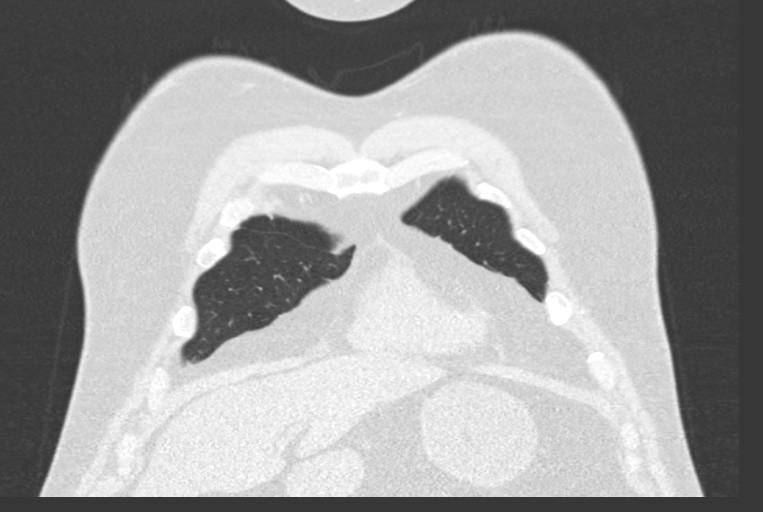
[im 64/160  lung]
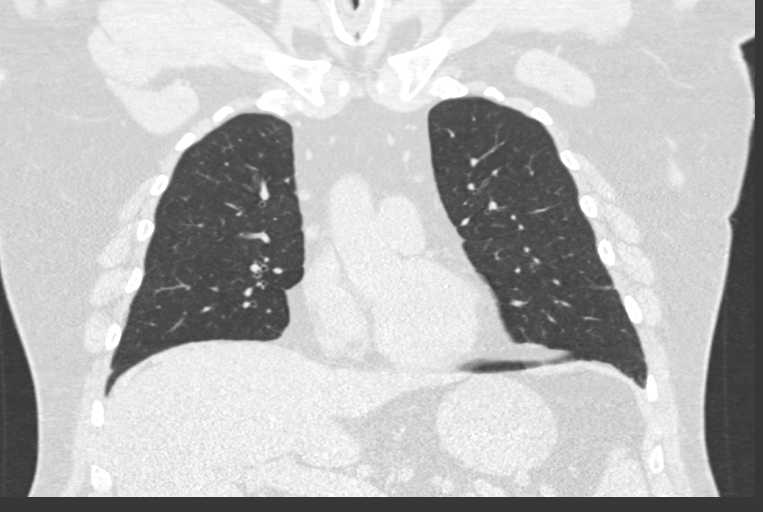
[im 96/160  lung]
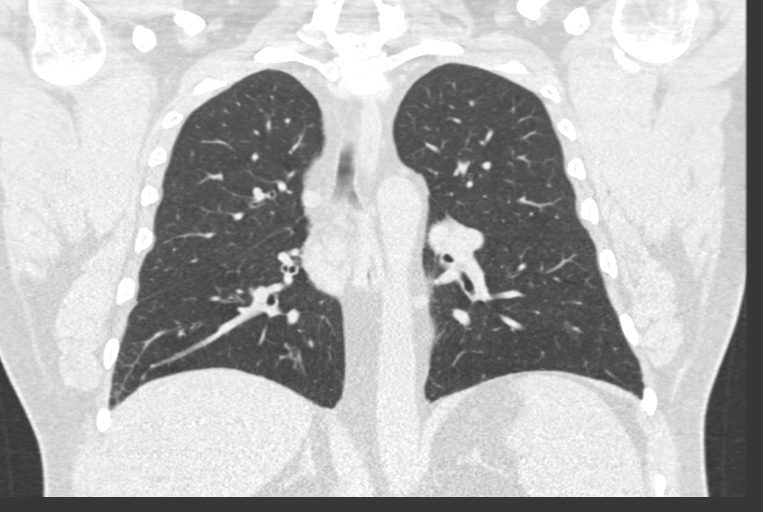

[15 of 36 positions shown; findings below may reference images not displayed]

FINDINGS: Cardiovascular: No significant vascular findings. Normal heart size.
No pericardial effusion.

Mediastinum/Nodes: The esophagus and thyroid gland are unremarkable.
Subcarinal adenopathy is noted with largest lymph node measuring 2
cm. 1.6 cm left hilar lymph node is noted. Aortopulmonary window
adenopathy is noted with largest lymph node measuring 14 mm. Right
paratracheal adenopathy is noted with largest lymph node measuring
16 mm.

Lungs/Pleura: No pneumothorax or pleural effusion is noted. Minimal
right posterior basilar subsegmental atelectasis or scarring is
noted.

Upper Abdomen: No acute abnormality.

Musculoskeletal: No chest wall mass or suspicious bone lesions
identified.
IMPRESSION: Significantly enlarged mediastinal adenopathy is noted, particularly
in the right paratracheal, subcarinal and left hilar regions. This
is concerning for possible metastatic disease or other malignancy.
Correlation with laboratory data is recommended to evaluate for
possible lymphoproliferative disorder. Also, further evaluation with
PET scan is recommended, or potentially three-month follow-up chest
CT with contrast administration.
